# Patient Record
Sex: Female | Born: 1970 | Race: Black or African American | Hispanic: No | Marital: Single | Smoking: Current some day smoker
Health system: Southern US, Community
[De-identification: ages and names within clinical notes are randomized; demographics above are authoritative.]

## PROBLEM LIST (undated history)

## (undated) DIAGNOSIS — N6002 Solitary cyst of left breast: Secondary | ICD-10-CM

## (undated) DIAGNOSIS — F329 Major depressive disorder, single episode, unspecified: Secondary | ICD-10-CM

## (undated) DIAGNOSIS — G629 Polyneuropathy, unspecified: Secondary | ICD-10-CM

## (undated) DIAGNOSIS — F32A Depression, unspecified: Secondary | ICD-10-CM

## (undated) DIAGNOSIS — F419 Anxiety disorder, unspecified: Secondary | ICD-10-CM

## (undated) DIAGNOSIS — G4700 Insomnia, unspecified: Secondary | ICD-10-CM

## (undated) DIAGNOSIS — M8580 Other specified disorders of bone density and structure, unspecified site: Secondary | ICD-10-CM

## (undated) DIAGNOSIS — I10 Essential (primary) hypertension: Secondary | ICD-10-CM

## (undated) HISTORY — PX: DG SALIVARY GLANDS (ARMC HX): HXRAD1603

## (undated) HISTORY — DX: Polyneuropathy, unspecified: G62.9

## (undated) HISTORY — PX: OTHER SURGICAL HISTORY: SHX169

## (undated) HISTORY — DX: Depression, unspecified: F32.A

## (undated) HISTORY — DX: Anxiety disorder, unspecified: F41.9

## (undated) HISTORY — DX: Major depressive disorder, single episode, unspecified: F32.9

## (undated) HISTORY — PX: TUMOR REMOVAL: SHX12

## (undated) HISTORY — DX: Insomnia, unspecified: G47.00

## (undated) HISTORY — DX: Other specified disorders of bone density and structure, unspecified site: M85.80

## (undated) HISTORY — DX: Solitary cyst of left breast: N60.02

---

## 2010-04-20 ENCOUNTER — Emergency Department: Payer: Self-pay | Admitting: Emergency Medicine

## 2011-01-07 ENCOUNTER — Ambulatory Visit: Payer: Self-pay | Admitting: Family Medicine

## 2011-01-26 ENCOUNTER — Ambulatory Visit: Payer: Self-pay | Admitting: Family Medicine

## 2012-02-25 ENCOUNTER — Ambulatory Visit: Payer: Self-pay | Admitting: Family Medicine

## 2012-12-29 ENCOUNTER — Ambulatory Visit: Payer: Self-pay | Admitting: Unknown Physician Specialty

## 2013-04-03 LAB — HM PAP SMEAR

## 2013-06-12 ENCOUNTER — Encounter: Payer: Self-pay | Admitting: Podiatry

## 2013-06-12 ENCOUNTER — Ambulatory Visit (INDEPENDENT_AMBULATORY_CARE_PROVIDER_SITE_OTHER): Payer: BC Managed Care – PPO | Admitting: Podiatry

## 2013-06-12 ENCOUNTER — Ambulatory Visit: Payer: Self-pay

## 2013-06-12 VITALS — BP 115/69 | HR 88 | Resp 12 | Ht 66.0 in | Wt 138.0 lb

## 2013-06-12 DIAGNOSIS — L600 Ingrowing nail: Secondary | ICD-10-CM

## 2013-06-12 NOTE — Patient Instructions (Signed)

## 2013-06-12 NOTE — Progress Notes (Signed)
Subjective:     Patient ID: Wanda Hudson, female   DOB: 03-Jan-1971, 42 y.o.   MRN: [id number]  HPI patient points to the second nails bilateral stating that they have been painful and that she has tried to trim them soak them without relief of her symptoms. Also complains about her hallux left medial border that is discolored she has had a history of hammertoe repair fifth left   Review of Systems  All other systems reviewed and are negative.       Objective:   Physical Exam  Vitals reviewed. Constitutional: She appears well-developed and well-nourished.  Cardiovascular: Intact distal pulses.   Musculoskeletal: Normal range of motion.  Neurological: She is alert.  Skin: Skin is warm.   patient has incurvated nail bed second right and left both medial and lateral borders of the right and medial border of the left second toe. Mild discoloration left hallux which could be an ingrown toenail which may form but currently not painful and scar fifth toe left. No other issues noted mild equinus and muscle strength normal noted    Assessment:     Ingrown toenail deformity second bilateral and mild deformity left hallux    Plan:     H&P done reviewed and recommended correction of chronic ingrown toenails. Patient wants procedure understanding that could stress the nails and she ultimately could lose the nails. Infiltrated each hallux 60 mg Xylocaine Marcaine mixture and removed the medial and lateral borders of the right second nail and the medial border left second nail apply chemical phenol 3 applications 30 seconds followed by alcohol lavage and sterile dressing reappoint a recheck

## 2013-06-12 NOTE — Progress Notes (Signed)
N SWOLLEN, SORE L  B/L 2ND TOE D  4 MONTHS O  SLOWLY C  WORSE A  SHOES T  N/A

## 2014-06-14 ENCOUNTER — Ambulatory Visit: Payer: Self-pay

## 2014-06-14 LAB — HM MAMMOGRAPHY

## 2015-01-30 ENCOUNTER — Other Ambulatory Visit: Payer: Self-pay | Admitting: Unknown Physician Specialty

## 2015-03-06 ENCOUNTER — Ambulatory Visit (INDEPENDENT_AMBULATORY_CARE_PROVIDER_SITE_OTHER): Payer: BLUE CROSS/BLUE SHIELD

## 2015-03-06 DIAGNOSIS — Z3042 Encounter for surveillance of injectable contraceptive: Secondary | ICD-10-CM | POA: Diagnosis not present

## 2015-03-06 MED ORDER — MEDROXYPROGESTERONE ACETATE 150 MG/ML IM SUSP
150.0000 mg | Freq: Once | INTRAMUSCULAR | Status: AC
  Administered 2015-03-06: 150 mg via INTRAMUSCULAR

## 2015-05-07 DIAGNOSIS — M8580 Other specified disorders of bone density and structure, unspecified site: Secondary | ICD-10-CM

## 2015-05-07 DIAGNOSIS — G4700 Insomnia, unspecified: Secondary | ICD-10-CM | POA: Insufficient documentation

## 2015-05-07 DIAGNOSIS — F32A Depression, unspecified: Secondary | ICD-10-CM

## 2015-05-07 DIAGNOSIS — G629 Polyneuropathy, unspecified: Secondary | ICD-10-CM | POA: Insufficient documentation

## 2015-05-07 DIAGNOSIS — F329 Major depressive disorder, single episode, unspecified: Secondary | ICD-10-CM

## 2015-05-12 ENCOUNTER — Encounter: Payer: Self-pay | Admitting: Unknown Physician Specialty

## 2015-05-12 ENCOUNTER — Ambulatory Visit (INDEPENDENT_AMBULATORY_CARE_PROVIDER_SITE_OTHER): Payer: BLUE CROSS/BLUE SHIELD | Admitting: Unknown Physician Specialty

## 2015-05-12 VITALS — BP 128/78 | HR 65 | Temp 98.9°F | Ht 66.1 in | Wt 120.5 lb

## 2015-05-12 DIAGNOSIS — Z0000 Encounter for general adult medical examination without abnormal findings: Secondary | ICD-10-CM | POA: Diagnosis not present

## 2015-05-12 DIAGNOSIS — Z23 Encounter for immunization: Secondary | ICD-10-CM

## 2015-05-12 DIAGNOSIS — G4700 Insomnia, unspecified: Secondary | ICD-10-CM

## 2015-05-12 MED ORDER — HYDROXYZINE HCL 10 MG PO TABS: 10.0000 mg | ORAL_TABLET | Freq: Every evening | ORAL | Status: DC | PRN

## 2015-05-12 NOTE — Progress Notes (Signed)
BP 128/78 mmHg  Pulse 65  Temp(Src) 98.9 F (37.2 C)  Ht 5' 6.1" (1.679 m)  Wt 120 lb 8 oz (54.658 kg)  BMI 19.39 kg/m2  SpO2 100%  LMP  (LMP Unknown)   Subjective:    Patient ID: Wanda Hudson, female    DOB: 02-06-1971, 44 y.o.   MRN: [id number]  HPI: Wanda Hudson is a 44 y.o. female  Chief Complaint  Patient presents with  . Annual Exam   Annual Exam She presents to the office for an annual physical examination.  She has a hx of depression and is currently taking Escitalopram 5 mg tablet daily medication compliance is excellent she denies have any side effects secondary to the medication.  She is satisfied with the current treatment.  Pertinent negatives denies chest pain, palpitations, lightheadedness/dizziness, shortness of breath, or edema.  Insomnia Pt states this occurs intermittently she works 12 hours during the day and has problems with sleeping at night.  She states she was prescribed a medication for insomnia that she takes occasionally twice a month that has improved her quality of sleep she is unsure of the name of the medication.  Relevant past medical, surgical, family and social history reviewed and updated as indicated. Interim medical history since our last visit reviewed. Allergies and medications reviewed and updated.  Review of Systems  Constitutional: Negative for fever, chills, appetite change and unexpected weight change.  Respiratory: Negative for cough, chest tightness, shortness of breath and wheezing.   Cardiovascular: Negative for chest pain, palpitations and leg swelling.  Gastrointestinal: Negative for nausea, vomiting, abdominal pain, diarrhea and constipation.  Endocrine: Negative for cold intolerance, heat intolerance, polydipsia, polyphagia and polyuria.  Genitourinary: Negative for dysuria, frequency, hematuria, flank pain, vaginal bleeding, vaginal discharge, genital sores, vaginal pain and pelvic pain.  Musculoskeletal: Negative  for myalgias, back pain, arthralgias, neck pain and neck stiffness.  Skin: Negative for color change and rash.  Neurological: Negative for dizziness, tremors, seizures, syncope, weakness, light-headedness, numbness and headaches.  Psychiatric/Behavioral: Negative for suicidal ideas, confusion, sleep disturbance and self-injury. The patient is not nervous/anxious.     Per HPI unless specifically indicated above     Objective:    BP 128/78 mmHg  Pulse 65  Temp(Src) 98.9 F (37.2 C)  Ht 5' 6.1" (1.679 m)  Wt 120 lb 8 oz (54.658 kg)  BMI 19.39 kg/m2  SpO2 100%  LMP  (LMP Unknown)  Wt Readings from Last 3 Encounters:  05/12/15 120 lb 8 oz (54.658 kg)  09/17/14 124 lb (56.246 kg)  06/12/13 138 lb (62.596 kg)    Physical Exam  Constitutional: She is oriented to person, place, and time. She appears well-developed and well-nourished. No distress.  HENT:  Head: Normocephalic and atraumatic.  Right Ear: External ear normal.  Left Ear: External ear normal.  Nose: Nose normal.  Mouth/Throat: Oropharynx is clear and moist.  Neck: Normal range of motion. Neck supple.  Cardiovascular: Normal rate, regular rhythm, normal heart sounds and intact distal pulses.   Pulmonary/Chest: Effort normal and breath sounds normal. No respiratory distress. She has no wheezes. She has no rales.  Abdominal: Soft. Bowel sounds are normal. She exhibits no distension and no mass. There is no tenderness. There is no rebound and no guarding.  Genitourinary: No breast swelling, tenderness, discharge or bleeding.  No breast masses or lesions  Musculoskeletal: Normal range of motion. She exhibits no edema or tenderness.  Lymphadenopathy:    She has no cervical  adenopathy.  Neurological: She is alert and oriented to person, place, and time.  Skin: Skin is warm and dry. No rash noted. She is not diaphoretic. No erythema. No pallor.  Psychiatric: She has a normal mood and affect. Her behavior is normal. Judgment and  thought content normal.      Assessment & Plan:   Problem List Items Addressed This Visit      Unprioritized   Insomnia    Prescribed Hydroxyzine 25 mg tablet daily at bedtime as needed for insomnia Educated pt about using this medication sparingly due to antihistamine component of medication      Routine general medical examination at a health care facility    CBC with Differential ordered results pending Lipid panel ordered results pending Comprehensive metabolic panel ordered results pending HIV antibody ordered results pending Lipid panel ordered results pending TSH ordered results pending Tdap vaccine administered Influenza vaccine administered         Relevant Orders   CBC with Differential/Platelet   Comprehensive metabolic panel   HIV antibody   TSH   Lipid Panel w/o Chol/HDL Ratio   Tdap vaccine greater than or equal to 7yo IM (Completed)    Other Visit Diagnoses    Immunization due    -  Primary    Relevant Orders    Flu Vaccine QUAD 36+ mos PF IM (Fluarix & Fluzone Quad PF) (Completed)        Follow up plan: Return in about 1 year (around 05/11/2016).

## 2015-05-12 NOTE — Assessment & Plan Note (Addendum)
CBC with Differential ordered results pending Lipid panel ordered results pending Comprehensive metabolic panel ordered results pending HIV antibody ordered results pending Lipid panel ordered results pending TSH ordered results pending Tdap vaccine administered Influenza vaccine administered

## 2015-05-12 NOTE — Assessment & Plan Note (Signed)
Prescribed Hydroxyzine 25 mg tablet daily at bedtime as needed for insomnia Educated pt about using this medication sparingly due to antihistamine component of medication

## 2015-05-13 ENCOUNTER — Encounter: Payer: Self-pay | Admitting: Unknown Physician Specialty

## 2015-05-13 LAB — COMPREHENSIVE METABOLIC PANEL
A/G RATIO: 1.6 (ref 1.1–2.5)
ALBUMIN: 4.5 g/dL (ref 3.5–5.5)
ALT: 12 IU/L (ref 0–32)
AST: 16 IU/L (ref 0–40)
Alkaline Phosphatase: 74 IU/L (ref 39–117)
BUN / CREAT RATIO: 19 (ref 9–23)
BUN: 11 mg/dL (ref 6–24)
Bilirubin Total: 0.4 mg/dL (ref 0.0–1.2)
CO2: 22 mmol/L (ref 18–29)
CREATININE: 0.59 mg/dL (ref 0.57–1.00)
Calcium: 10.2 mg/dL (ref 8.7–10.2)
Chloride: 100 mmol/L (ref 97–108)
GFR calc Af Amer: 127 mL/min/{1.73_m2} (ref >59)
GFR calc non Af Amer: 110 mL/min/{1.73_m2} (ref >59)
GLOBULIN, TOTAL: 2.8 g/dL (ref 1.5–4.5)
Glucose: 93 mg/dL (ref 65–99)
Potassium: 4.3 mmol/L (ref 3.5–5.2)
SODIUM: 140 mmol/L (ref 134–144)
Total Protein: 7.3 g/dL (ref 6.0–8.5)

## 2015-05-13 LAB — CBC WITH DIFFERENTIAL/PLATELET
Basophils Absolute: 0.1 10*3/uL (ref 0.0–0.2)
Basos: 1 %
EOS (ABSOLUTE): 0.1 10*3/uL (ref 0.0–0.4)
EOS: 1 %
HEMATOCRIT: 42.6 % (ref 34.0–46.6)
HEMOGLOBIN: 13.8 g/dL (ref 11.1–15.9)
Immature Grans (Abs): 0 10*3/uL (ref 0.0–0.1)
Immature Granulocytes: 0 %
LYMPHS ABS: 2.3 10*3/uL (ref 0.7–3.1)
Lymphs: 25 %
MCH: 28.3 pg (ref 26.6–33.0)
MCHC: 32.4 g/dL (ref 31.5–35.7)
MCV: 87 fL (ref 79–97)
MONOS ABS: 0.8 10*3/uL (ref 0.1–0.9)
Monocytes: 8 %
NEUTROS ABS: 5.9 10*3/uL (ref 1.4–7.0)
Neutrophils: 65 %
Platelets: 334 10*3/uL (ref 150–379)
RBC: 4.88 x10E6/uL (ref 3.77–5.28)
RDW: 13.6 % (ref 12.3–15.4)
WBC: 9.1 10*3/uL (ref 3.4–10.8)

## 2015-05-13 LAB — LIPID PANEL W/O CHOL/HDL RATIO
Cholesterol, Total: 174 mg/dL (ref 100–199)
HDL: 47 mg/dL (ref >39)
LDL Calculated: 109 mg/dL — ABNORMAL HIGH (ref 0–99)
Triglycerides: 88 mg/dL (ref 0–149)
VLDL CHOLESTEROL CAL: 18 mg/dL (ref 5–40)

## 2015-05-13 LAB — TSH: TSH: 0.481 u[IU]/mL (ref 0.450–4.500)

## 2015-05-13 LAB — HIV ANTIBODY (ROUTINE TESTING W REFLEX): HIV SCREEN 4TH GENERATION: NONREACTIVE

## 2015-05-26 ENCOUNTER — Ambulatory Visit: Payer: BLUE CROSS/BLUE SHIELD

## 2015-05-29 ENCOUNTER — Ambulatory Visit (INDEPENDENT_AMBULATORY_CARE_PROVIDER_SITE_OTHER): Payer: BLUE CROSS/BLUE SHIELD

## 2015-05-29 DIAGNOSIS — Z308 Encounter for other contraceptive management: Secondary | ICD-10-CM

## 2015-05-29 MED ORDER — MEDROXYPROGESTERONE ACETATE 150 MG/ML IM SUSP
150.0000 mg | Freq: Once | INTRAMUSCULAR | Status: AC
  Administered 2015-05-29: 150 mg via INTRAMUSCULAR

## 2015-08-07 ENCOUNTER — Other Ambulatory Visit: Payer: Self-pay

## 2015-08-07 MED ORDER — ESCITALOPRAM OXALATE 5 MG PO TABS: 5.0000 mg | ORAL_TABLET | Freq: Every day | ORAL | Status: DC

## 2015-08-07 NOTE — Telephone Encounter (Signed)
Last Visit: 05/12/2015 Next Appt: 08/14/2015  Request for Escitalopram 5 mg tablet #90.

## 2015-08-12 ENCOUNTER — Other Ambulatory Visit: Payer: Self-pay

## 2015-08-12 MED ORDER — ESCITALOPRAM OXALATE 5 MG PO TABS: 5.0000 mg | ORAL_TABLET | Freq: Every day | ORAL | Status: DC

## 2015-08-12 NOTE — Telephone Encounter (Signed)
Rx sent to her pharmacy 

## 2015-08-12 NOTE — Telephone Encounter (Signed)
Patient was last seen 05/12/15 and has appt scheduled for 05/12/16. Pharmacy is [facility] in [place]. Medication was refilled 08/07/15 for 30 days but pharmacy is requesting 90 day supply.

## 2015-08-14 ENCOUNTER — Ambulatory Visit: Payer: BLUE CROSS/BLUE SHIELD

## 2015-08-14 ENCOUNTER — Other Ambulatory Visit: Payer: Self-pay | Admitting: Unknown Physician Specialty

## 2015-08-14 DIAGNOSIS — Z3009 Encounter for other general counseling and advice on contraception: Secondary | ICD-10-CM

## 2015-08-14 MED ORDER — MEDROXYPROGESTERONE ACETATE 150 MG/ML IM SUSP
150.0000 mg | INTRAMUSCULAR | Status: AC
  Administered 2015-08-19: 150 mg via INTRAMUSCULAR

## 2015-08-14 NOTE — Progress Notes (Signed)
This encounter was created in error - please disregard.  This encounter was created in error - please disregard.

## 2015-08-17 DIAGNOSIS — N6002 Solitary cyst of left breast: Secondary | ICD-10-CM

## 2015-08-17 HISTORY — PX: BREAST CYST ASPIRATION: SHX578

## 2015-08-17 HISTORY — DX: Solitary cyst of left breast: N60.02

## 2015-08-19 ENCOUNTER — Telehealth: Payer: Self-pay

## 2015-08-19 ENCOUNTER — Ambulatory Visit (INDEPENDENT_AMBULATORY_CARE_PROVIDER_SITE_OTHER): Payer: BLUE CROSS/BLUE SHIELD

## 2015-08-19 DIAGNOSIS — Z3009 Encounter for other general counseling and advice on contraception: Secondary | ICD-10-CM

## 2015-08-19 DIAGNOSIS — Z3042 Encounter for surveillance of injectable contraceptive: Secondary | ICD-10-CM

## 2015-08-19 NOTE — Telephone Encounter (Signed)
Called patient to let her know we now had depo injections so she could call to schedule her appointment.

## 2015-08-19 NOTE — Telephone Encounter (Signed)
Patient scheduled depo injection for this afternoon.

## 2015-11-05 ENCOUNTER — Ambulatory Visit (INDEPENDENT_AMBULATORY_CARE_PROVIDER_SITE_OTHER): Payer: BLUE CROSS/BLUE SHIELD

## 2015-11-05 ENCOUNTER — Other Ambulatory Visit: Payer: Self-pay | Admitting: Unknown Physician Specialty

## 2015-11-05 DIAGNOSIS — Z308 Encounter for other contraceptive management: Secondary | ICD-10-CM

## 2015-11-05 DIAGNOSIS — Z3042 Encounter for surveillance of injectable contraceptive: Secondary | ICD-10-CM

## 2015-11-05 MED ORDER — MEDROXYPROGESTERONE ACETATE 150 MG/ML IM SUSP
150.0000 mg | INTRAMUSCULAR | Status: AC
  Administered 2015-11-05 – 2016-07-14 (×4): 150 mg via INTRAMUSCULAR

## 2015-11-24 ENCOUNTER — Other Ambulatory Visit: Payer: Self-pay

## 2015-11-24 MED ORDER — HYDROXYZINE HCL 10 MG PO TABS: 10.0000 mg | ORAL_TABLET | Freq: Every evening | ORAL | Status: DC | PRN

## 2015-11-24 NOTE — Telephone Encounter (Signed)
Patient had physical on 05/12/15 and has next physical scheduled for 05/14/16. Pharmacy is [facility] in [place].

## 2015-12-01 ENCOUNTER — Telehealth: Payer: Self-pay

## 2015-12-01 MED ORDER — HYDROXYZINE HCL 10 MG PO TABS: 10.0000 mg | ORAL_TABLET | Freq: Every evening | ORAL | Status: DC | PRN

## 2015-12-01 NOTE — Telephone Encounter (Signed)
Pharmacy sent a fax requesting a 90 day prescription for patient's hydroxyzine. We sent in 30 with 2 refills on 11/24/15. Wanda Hudson- I dont know if you want to change the rx or not.

## 2015-12-01 NOTE — Telephone Encounter (Signed)
Will change

## 2016-02-03 ENCOUNTER — Ambulatory Visit (INDEPENDENT_AMBULATORY_CARE_PROVIDER_SITE_OTHER): Payer: BLUE CROSS/BLUE SHIELD

## 2016-02-03 DIAGNOSIS — Z308 Encounter for other contraceptive management: Secondary | ICD-10-CM | POA: Diagnosis not present

## 2016-02-03 DIAGNOSIS — Z3042 Encounter for surveillance of injectable contraceptive: Secondary | ICD-10-CM

## 2016-03-02 ENCOUNTER — Other Ambulatory Visit: Payer: Self-pay

## 2016-03-02 MED ORDER — ESCITALOPRAM OXALATE 5 MG PO TABS: 5.0000 mg | ORAL_TABLET | Freq: Every day | ORAL | Status: DC

## 2016-04-27 ENCOUNTER — Ambulatory Visit (INDEPENDENT_AMBULATORY_CARE_PROVIDER_SITE_OTHER): Payer: BLUE CROSS/BLUE SHIELD

## 2016-04-27 DIAGNOSIS — Z308 Encounter for other contraceptive management: Secondary | ICD-10-CM

## 2016-04-27 DIAGNOSIS — Z3042 Encounter for surveillance of injectable contraceptive: Secondary | ICD-10-CM

## 2016-05-03 ENCOUNTER — Other Ambulatory Visit: Payer: Self-pay | Admitting: Unknown Physician Specialty

## 2016-05-03 NOTE — Telephone Encounter (Signed)
rx

## 2016-05-12 ENCOUNTER — Encounter: Payer: BLUE CROSS/BLUE SHIELD | Admitting: Unknown Physician Specialty

## 2016-05-14 ENCOUNTER — Ambulatory Visit (INDEPENDENT_AMBULATORY_CARE_PROVIDER_SITE_OTHER): Payer: BLUE CROSS/BLUE SHIELD | Admitting: Unknown Physician Specialty

## 2016-05-14 ENCOUNTER — Encounter: Payer: Self-pay | Admitting: Unknown Physician Specialty

## 2016-05-14 VITALS — BP 161/82 | HR 89 | Temp 98.3°F | Ht 65.5 in | Wt 123.6 lb

## 2016-05-14 DIAGNOSIS — N63 Unspecified lump in unspecified breast: Secondary | ICD-10-CM | POA: Insufficient documentation

## 2016-05-14 DIAGNOSIS — Z23 Encounter for immunization: Secondary | ICD-10-CM

## 2016-05-14 DIAGNOSIS — Z0000 Encounter for general adult medical examination without abnormal findings: Secondary | ICD-10-CM | POA: Diagnosis not present

## 2016-05-14 DIAGNOSIS — F329 Major depressive disorder, single episode, unspecified: Secondary | ICD-10-CM

## 2016-05-14 DIAGNOSIS — F32A Depression, unspecified: Secondary | ICD-10-CM

## 2016-05-14 MED ORDER — LORAZEPAM 0.5 MG PO TABS: 0.5000 mg | ORAL_TABLET | Freq: Every day | ORAL | 0 refills | Status: DC

## 2016-05-14 NOTE — Progress Notes (Signed)
BP (!) 161/82 (BP Location: Left Arm, Patient Position: Sitting, Cuff Size: Normal)   Pulse 89   Temp 98.3 F (36.8 C)   Ht 5' 5.5" (1.664 m)   Wt 123 lb 9.6 oz (56.1 kg)   LMP  (LMP Unknown)   SpO2 99%   BMI 20.26 kg/m    Subjective:    Patient ID: Wanda Hudson, female    DOB: 1970/10/08, 45 y.o.   MRN: [id number]  HPI: Wanda Hudson is a 45 y.o. female  Chief Complaint  Patient presents with  . Annual Exam    pt states she has found a lump in left breast    Depression Depression screen [facility] 2/9 05/14/2016 05/12/2015  Decreased Interest 0 0  Down, Depressed, Hopeless 0 0  PHQ - 2 Score 0 0  Altered sleeping 1 -  Tired, decreased energy 0 -  Change in appetite 1 -  Feeling bad or failure about yourself  0 -  Trouble concentrating 0 -  Moving slowly or fidgety/restless 0 -  Suicidal thoughts 0 -  PHQ-9 Score 2 -   "I found a lump" which she discovered over the weekend.  She thinks this is why BP is high as she is nervous.    Social History   Social History  . Marital status: Single    Spouse name: N/A  . Number of children: N/A  . Years of education: N/A   Occupational History  . Not on file.   Social History Main Topics  . Smoking status: Current Some Day Smoker  . Smokeless tobacco: Never Used  . Alcohol use No  . Drug use: No  . Sexual activity: Yes    Birth control/ protection: Injection   Other Topics Concern  . Not on file   Social History Narrative  . No narrative on file   Family History  Problem Relation Age of Onset  . Diabetes Mother   . Hypertension Mother   . Stroke Mother   . Diabetes Father   . Hypertension Father   . Heart disease Maternal Grandfather     MI  . Heart disease Paternal Grandfather     MI  . Hypertension Brother   . Aneurysm Maternal Grandmother    Past Medical History:  Diagnosis Date  . Anxiety   . Depression   . Insomnia   . Osteopenia   . Peripheral neuropathy [facility])    Past Surgical History:    Procedure Laterality Date  . 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 DG SALIVARY GLANDS ([place] HX)    . TUMOR REMOVAL     ears    Relevant past medical, surgical, family and social history reviewed and updated as indicated. Interim medical history since our last visit reviewed. Allergies and medications reviewed and updated.  Review of Systems  Per HPI unless specifically indicated above     Objective:    BP (!) 161/82 (BP Location: Left Arm, Patient Position: Sitting, Cuff Size: Normal)   Pulse 89   Temp 98.3 F (36.8 C)   Ht 5' 5.5" (1.664 m)   Wt 123 lb 9.6 oz (56.1 kg)   LMP  (LMP Unknown)   SpO2 99%   BMI 20.26 kg/m   Wt Readings from Last 3 Encounters:  05/14/16 123 lb 9.6 oz (56.1 kg)  05/12/15 120 lb 8 oz (54.7 kg)  09/17/14 124 lb (56.2 kg)    Physical Exam  Constitutional: She is oriented  to person, place, and time. She appears well-developed and well-nourished.  HENT:  Head: Normocephalic and atraumatic.  Eyes: Pupils are equal, round, and reactive to light. Right eye exhibits no discharge. Left eye exhibits no discharge. No scleral icterus.  Neck: Normal range of motion. Neck supple. Carotid bruit is not present. No thyromegaly present.  Cardiovascular: Normal rate, regular rhythm and normal heart sounds.  Exam reveals no gallop and no friction rub.   No murmur heard. Pulmonary/Chest: Effort normal and breath sounds normal. No respiratory distress. She has no wheezes. She has no rales. Right breast exhibits no inverted nipple, no mass, no nipple discharge, no skin change and no tenderness. Left breast exhibits mass. Left breast exhibits no inverted nipple, no nipple discharge, no skin change and no tenderness.  Large mass left breast.  Not irregular  Abdominal: Soft. Bowel sounds are normal. There is no tenderness. There is no rebound.  Genitourinary: Vagina normal and uterus normal. Cervix exhibits no motion tenderness, no discharge and no friability. Right  adnexum displays no mass, no tenderness and no fullness. Left adnexum displays no mass, no tenderness and no fullness.  Musculoskeletal: Normal range of motion.  Lymphadenopathy:    She has no cervical adenopathy.  Neurological: She is alert and oriented to person, place, and time.  Skin: Skin is warm, dry and intact. No rash noted.  Psychiatric: She has a normal mood and affect. Her speech is normal and behavior is normal. Judgment and thought content normal. Cognition and memory are normal.    Results for orders placed or performed in visit on 05/12/15  CBC with Differential/Platelet  Result Value Ref Range   WBC 9.1 3.4 - 10.8 x10E3/uL   RBC 4.88 3.77 - 5.28 x10E6/uL   Hemoglobin 13.8 11.1 - 15.9 g/dL   Hematocrit 42.6 34.0 - 46.6 %   MCV 87 79 - 97 fL   MCH 28.3 26.6 - 33.0 pg   MCHC 32.4 31.5 - 35.7 g/dL   RDW 13.6 12.3 - 15.4 %   Platelets 334 150 - 379 x10E3/uL   Neutrophils 65 %   Lymphs 25 %   Monocytes 8 %   Eos 1 %   Basos 1 %   Neutrophils Absolute 5.9 1.4 - 7.0 x10E3/uL   Lymphocytes Absolute 2.3 0.7 - 3.1 x10E3/uL   Monocytes Absolute 0.8 0.1 - 0.9 x10E3/uL   EOS (ABSOLUTE) 0.1 0.0 - 0.4 x10E3/uL   Basophils Absolute 0.1 0.0 - 0.2 x10E3/uL   Immature Granulocytes 0 %   Immature Grans (Abs) 0.0 0.0 - 0.1 x10E3/uL  Comprehensive metabolic panel  Result Value Ref Range   Glucose 93 65 - 99 mg/dL   BUN 11 6 - 24 mg/dL   Creatinine, Ser 0.59 0.57 - 1.00 mg/dL   GFR calc non Af Amer 110 >59 mL/min/1.73   GFR calc Af Amer 127 >59 mL/min/1.73   BUN/Creatinine Ratio 19 9 - 23   Sodium 140 134 - 144 mmol/L   Potassium 4.3 3.5 - 5.2 mmol/L   Chloride 100 97 - 108 mmol/L   CO2 22 18 - 29 mmol/L   Calcium 10.2 8.7 - 10.2 mg/dL   Total Protein 7.3 6.0 - 8.5 g/dL   Albumin 4.5 3.5 - 5.5 g/dL   Globulin, Total 2.8 1.5 - 4.5 g/dL   Albumin/Globulin Ratio 1.6 1.1 - 2.5   Bilirubin Total 0.4 0.0 - 1.2 mg/dL   Alkaline Phosphatase 74 39 - 117 IU/L   AST 16 0 -  40 IU/L    ALT 12 0 - 32 IU/L  HIV antibody  Result Value Ref Range   HIV Screen 4th Generation wRfx Non Reactive Non Reactive  TSH  Result Value Ref Range   TSH 0.481 0.450 - 4.500 uIU/mL  Lipid Panel w/o Chol/HDL Ratio  Result Value Ref Range   Cholesterol, Total 174 100 - 199 mg/dL   Triglycerides 88 0 - 149 mg/dL   HDL 47 >39 mg/dL   VLDL Cholesterol Cal 18 5 - 40 mg/dL   LDL Calculated 109 (H) 0 - 99 mg/dL      Assessment & Plan:   Problem List Items Addressed This Visit      Unprioritized   Breast mass in female    Schedule mammogram and refer to surgery      Relevant Orders   MM Digital Diagnostic Unilat L   MM DIGITAL SCREENING BILATERAL   Depressive disorder    Stable, continue present medications.         Other Visit Diagnoses    Need for influenza vaccination    -  Primary   Relevant Orders   Flu Vaccine QUAD 36+ mos IM (Completed)   Annual physical exam       Relevant Orders   CBC with Differential/Platelet   Comprehensive metabolic panel   Lipid Panel w/o Chol/HDL Ratio   TSH   MM DIGITAL SCREENING BILATERAL       Follow up plan: No Follow-up on file.

## 2016-05-14 NOTE — Assessment & Plan Note (Signed)
Schedule mammogram and refer to surgery

## 2016-05-14 NOTE — Addendum Note (Signed)
Addended by: Kathrine Haddock on: 05/14/2016 09:01 AM   Modules accepted: Orders

## 2016-05-14 NOTE — Patient Instructions (Signed)

## 2016-05-14 NOTE — Assessment & Plan Note (Signed)
Stable, continue present medications.   

## 2016-05-15 LAB — LIPID PANEL W/O CHOL/HDL RATIO
CHOLESTEROL TOTAL: 165 mg/dL (ref 100–199)
HDL: 43 mg/dL (ref >39)
LDL Calculated: 102 mg/dL — ABNORMAL HIGH (ref 0–99)
Triglycerides: 99 mg/dL (ref 0–149)
VLDL CHOLESTEROL CAL: 20 mg/dL (ref 5–40)

## 2016-05-15 LAB — COMPREHENSIVE METABOLIC PANEL
ALBUMIN: 4.5 g/dL (ref 3.5–5.5)
ALK PHOS: 65 IU/L (ref 39–117)
ALT: 6 IU/L (ref 0–32)
AST: 15 IU/L (ref 0–40)
Albumin/Globulin Ratio: 1.6 (ref 1.2–2.2)
BILIRUBIN TOTAL: 0.3 mg/dL (ref 0.0–1.2)
BUN / CREAT RATIO: 19 (ref 9–23)
BUN: 11 mg/dL (ref 6–24)
CHLORIDE: 101 mmol/L (ref 96–106)
CO2: 22 mmol/L (ref 18–29)
Calcium: 10 mg/dL (ref 8.7–10.2)
Creatinine, Ser: 0.57 mg/dL (ref 0.57–1.00)
GFR calc Af Amer: 128 mL/min/{1.73_m2} (ref >59)
GFR calc non Af Amer: 111 mL/min/{1.73_m2} (ref >59)
GLUCOSE: 91 mg/dL (ref 65–99)
Globulin, Total: 2.8 g/dL (ref 1.5–4.5)
Potassium: 4 mmol/L (ref 3.5–5.2)
Sodium: 139 mmol/L (ref 134–144)
Total Protein: 7.3 g/dL (ref 6.0–8.5)

## 2016-05-15 LAB — CBC WITH DIFFERENTIAL/PLATELET
BASOS ABS: 0 10*3/uL (ref 0.0–0.2)
Basos: 0 %
EOS (ABSOLUTE): 0.1 10*3/uL (ref 0.0–0.4)
Eos: 1 %
HEMOGLOBIN: 13.4 g/dL (ref 11.1–15.9)
Hematocrit: 40.8 % (ref 34.0–46.6)
Immature Grans (Abs): 0 10*3/uL (ref 0.0–0.1)
Immature Granulocytes: 0 %
LYMPHS ABS: 2.3 10*3/uL (ref 0.7–3.1)
Lymphs: 22 %
MCH: 28.3 pg (ref 26.6–33.0)
MCHC: 32.8 g/dL (ref 31.5–35.7)
MCV: 86 fL (ref 79–97)
MONOS ABS: 1 10*3/uL — AB (ref 0.1–0.9)
Monocytes: 9 %
NEUTROS ABS: 7.1 10*3/uL — AB (ref 1.4–7.0)
Neutrophils: 68 %
PLATELETS: 346 10*3/uL (ref 150–379)
RBC: 4.73 x10E6/uL (ref 3.77–5.28)
RDW: 13.4 % (ref 12.3–15.4)
WBC: 10.6 10*3/uL (ref 3.4–10.8)

## 2016-05-15 LAB — TSH: TSH: 1.01 u[IU]/mL (ref 0.450–4.500)

## 2016-05-17 ENCOUNTER — Other Ambulatory Visit: Payer: Self-pay | Admitting: Unknown Physician Specialty

## 2016-05-17 ENCOUNTER — Ambulatory Visit
Admission: RE | Admit: 2016-05-17 | Discharge: 2016-05-17 | Disposition: A | Discharge status: Home / Self Care | Payer: BLUE CROSS/BLUE SHIELD | Source: Ambulatory Visit

## 2016-05-17 ENCOUNTER — Ambulatory Visit
Admission: RE | Admit: 2016-05-17 | Discharge: 2016-05-17 | Disposition: A | Discharge status: Home / Self Care | Payer: BLUE CROSS/BLUE SHIELD | Source: Ambulatory Visit | Attending: Unknown Physician Specialty | Admitting: Unknown Physician Specialty

## 2016-05-17 ENCOUNTER — Encounter: Payer: Self-pay | Admitting: Unknown Physician Specialty

## 2016-05-17 ENCOUNTER — Telehealth: Payer: Self-pay | Admitting: Unknown Physician Specialty

## 2016-05-17 DIAGNOSIS — N63 Unspecified lump in unspecified breast: Secondary | ICD-10-CM

## 2016-05-17 DIAGNOSIS — N6002 Solitary cyst of left breast: Secondary | ICD-10-CM

## 2016-05-17 DIAGNOSIS — Z0000 Encounter for general adult medical examination without abnormal findings: Secondary | ICD-10-CM

## 2016-05-17 NOTE — Telephone Encounter (Signed)
Routing to provider  

## 2016-05-17 NOTE — Telephone Encounter (Signed)
Wanda Hudson from [facility] would like to get an order for a Diagnostic bilateral mammogram as well as right and left ultrasound. Pt is already there for an appt.

## 2016-05-17 NOTE — Progress Notes (Signed)
Normal labs.  Patient notified by letter.

## 2016-05-18 ENCOUNTER — Other Ambulatory Visit: Payer: BLUE CROSS/BLUE SHIELD

## 2016-05-18 LAB — IGP, APTIMA HPV, RFX 16/18,45
HPV APTIMA: NEGATIVE
PAP SMEAR COMMENT: 0

## 2016-05-19 ENCOUNTER — Encounter: Payer: Self-pay | Admitting: General Surgery

## 2016-05-19 ENCOUNTER — Telehealth: Payer: Self-pay | Admitting: Unknown Physician Specialty

## 2016-05-19 DIAGNOSIS — N63 Unspecified lump in unspecified breast: Secondary | ICD-10-CM

## 2016-05-19 NOTE — Telephone Encounter (Signed)
Discussed [place].  Will refer to Dr. Fleet Contras for further management of breast cyst

## 2016-05-26 ENCOUNTER — Encounter: Payer: Self-pay | Admitting: *Deleted

## 2016-05-27 ENCOUNTER — Ambulatory Visit (INDEPENDENT_AMBULATORY_CARE_PROVIDER_SITE_OTHER): Payer: BLUE CROSS/BLUE SHIELD | Admitting: General Surgery

## 2016-05-27 ENCOUNTER — Inpatient Hospital Stay: Payer: Self-pay

## 2016-05-27 ENCOUNTER — Encounter: Payer: Self-pay | Admitting: General Surgery

## 2016-05-27 VITALS — BP 138/72 | HR 76 | Resp 12 | Ht 65.0 in | Wt 122.0 lb

## 2016-05-27 DIAGNOSIS — N6002 Solitary cyst of left breast: Secondary | ICD-10-CM

## 2016-05-27 NOTE — Progress Notes (Signed)
Patient ID: Wanda Hudson, female   DOB: February 20, 1971, 45 y.o.   MRN: [id number]  Chief Complaint  Patient presents with  . Breast Problem    abnormal mammogram    HPI Wanda Hudson is a 45 y.o. female.  who presents for a breast evaluation. The most recent mammogram and ultrasound was done on 05-17-16.  Patient does perform regular self breast checks and gets regular mammograms done.   She is here today with her aunt Wanda Hudson.74 She can feel a lump in left breast noticed 05-06-16 because it was itching. It has not changed in size, no pain.  HPI  Past Medical History:  Diagnosis Date  . Anxiety   . Depression   . Insomnia   . Osteopenia   . Peripheral neuropathy [facility])     Past Surgical History:  Procedure Laterality Date  . 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 DG SALIVARY GLANDS ([place] HX)    . TUMOR REMOVAL     ears    Family History  Problem Relation Age of Onset  . Diabetes Mother   . Hypertension Mother   . Stroke Mother   . Diabetes Father   . Hypertension Father   . Heart disease Maternal Grandfather     MI  . Heart disease Paternal Grandfather     MI  . Hypertension Brother   . Aneurysm Maternal Grandmother     Social History Social History  Substance Use Topics  . Smoking status: Current Some Day Smoker    Packs/day: 0.50    Years: 21.00    Types: Cigarettes  . Smokeless tobacco: Never Used  . Alcohol use No     Comment: occasionally    Allergies  Allergen Reactions  . Codeine Rash    Current Outpatient Prescriptions  Medication Sig Dispense Refill  . Aspirin-Salicylamide-Caffeine (BC FAST PAIN RELIEF) [phone] MG PACK Take by mouth as needed.    Marland Kitchen escitalopram (LEXAPRO) 5 MG tablet Take 1 tablet (5 mg total) by mouth daily. 90 tablet 1  . Evening Primrose Oil 500 MG CAPS Take 500 mg by mouth daily.    . hydrOXYzine (ATARAX/VISTARIL) 10 MG tablet TAKE 1 TABLET (10 MG TOTAL) BY MOUTH AT BEDTIME AS NEEDED. 90 tablet 1  . LORazepam  (ATIVAN) 0.5 MG tablet Take 1 tablet (0.5 mg total) by mouth at bedtime. 20 tablet 0  . vitamin B-12 (CYANOCOBALAMIN) 250 MCG tablet Take 250 mcg by mouth daily.    . Vitamin D, Cholecalciferol, 1000 UNITS TABS Take 1,000 Units by mouth daily.     Current Facility-Administered Medications  Medication Dose Route Frequency Provider Last Rate Last Dose  . medroxyPROGESTERone (DEPO-PROVERA) injection 150 mg  150 mg Intramuscular Q90 days Kathrine Haddock, NP   150 mg at 08/19/15 1332  . medroxyPROGESTERone (DEPO-PROVERA) injection 150 mg  150 mg Intramuscular Q90 days Kathrine Haddock, NP   150 mg at 04/27/16 [id number]    Review of Systems Review of Systems  Constitutional: Negative.   Respiratory: Negative.   Cardiovascular: Negative.     Blood pressure 138/72, pulse 76, resp. rate 12, height 5\' 5"  (1.651 m), weight 122 lb (55.3 kg).  Physical Exam Physical Exam  Constitutional: She is oriented to person, place, and time. She appears well-developed and well-nourished.  HENT:  Mouth/Throat: Oropharynx is clear and moist.  Eyes: Conjunctivae are normal. No scleral icterus.  Neck: Neck supple.  Cardiovascular: Normal rate, regular rhythm and normal heart sounds.  Pulmonary/Chest: Effort normal and breath sounds normal. Right breast exhibits no inverted nipple, no mass, no nipple discharge, no skin change and no tenderness. Left breast exhibits mass. Left breast exhibits no inverted nipple, no nipple discharge, no skin change and no tenderness.    Mass left breast  Lymphadenopathy:    She has no cervical adenopathy.    She has no axillary adenopathy.  Neurological: She is alert and oriented to person, place, and time.  Skin: Skin is warm and dry.  Psychiatric: Her behavior is normal.    Data Reviewed Bilateral mammograms of 05/17/2016 as well as left breast ultrasound the same day were reviewed. BI-RADS-2.  Ultrasound examination of the left breast in the upper-outer quadrant showed a 2.5 x  5.5 x 5.76 cm simple cyst with strong posterior acoustic enhancement and no internal lesions. The patient requested aspiration secondary to local discomfort. This was completed using 1 mL of 1% plain Xylocaine. The area yielded 35 mL of slightly turbid fluid which was discarded. The cyst resolved completely. The cavity was filled with 15 mL of air which was well tolerated.   Assessment     Left breast cyst, resolved with aspiration..      Plan    The itching may have occurred from underlying pressure from the cyst. The patient has been asked to call one week with her progress report in this regard.  With of moderate volume size of the cyst she is aware that this may recur and she has been urged to call the office directly if this occurs.     Follow up as needed. Phone follow up in one week regarding itching. The patient is aware to call back for any questions or concerns. Continue self breast exams. Call office for any new breast issues or concerns.   This information has been scribed by Karie Fetch RN, BSN,BC.   Wanda Hudson 05/27/2016, 9:43 PM

## 2016-05-27 NOTE — Patient Instructions (Addendum)
The patient is aware to call back for any questions or concerns. Follow up as needed. Phone follow up in one week regarding itching Continue self breast exams. Call office for any new breast issues or concerns.

## 2016-07-14 ENCOUNTER — Ambulatory Visit (INDEPENDENT_AMBULATORY_CARE_PROVIDER_SITE_OTHER): Payer: BLUE CROSS/BLUE SHIELD

## 2016-07-14 DIAGNOSIS — Z3042 Encounter for surveillance of injectable contraceptive: Secondary | ICD-10-CM

## 2016-07-14 DIAGNOSIS — Z308 Encounter for other contraceptive management: Secondary | ICD-10-CM

## 2016-09-29 ENCOUNTER — Ambulatory Visit: Payer: BLUE CROSS/BLUE SHIELD

## 2016-09-29 ENCOUNTER — Ambulatory Visit (INDEPENDENT_AMBULATORY_CARE_PROVIDER_SITE_OTHER): Payer: BLUE CROSS/BLUE SHIELD

## 2016-09-29 DIAGNOSIS — Z3042 Encounter for surveillance of injectable contraceptive: Secondary | ICD-10-CM

## 2016-09-29 MED ORDER — MEDROXYPROGESTERONE ACETATE 150 MG/ML IM SUSP
150.0000 mg | Freq: Once | INTRAMUSCULAR | Status: AC
  Administered 2016-09-29: 150 mg via INTRAMUSCULAR

## 2016-12-15 ENCOUNTER — Ambulatory Visit: Payer: BLUE CROSS/BLUE SHIELD

## 2016-12-29 ENCOUNTER — Ambulatory Visit (INDEPENDENT_AMBULATORY_CARE_PROVIDER_SITE_OTHER): Payer: BLUE CROSS/BLUE SHIELD

## 2016-12-29 DIAGNOSIS — Z3042 Encounter for surveillance of injectable contraceptive: Secondary | ICD-10-CM | POA: Diagnosis not present

## 2016-12-29 MED ORDER — MEDROXYPROGESTERONE ACETATE 150 MG/ML IM SUSP
150.0000 mg | Freq: Once | INTRAMUSCULAR | Status: AC
  Administered 2016-12-29: 150 mg via INTRAMUSCULAR

## 2017-03-21 ENCOUNTER — Ambulatory Visit (INDEPENDENT_AMBULATORY_CARE_PROVIDER_SITE_OTHER): Payer: 59

## 2017-03-21 DIAGNOSIS — Z3042 Encounter for surveillance of injectable contraceptive: Secondary | ICD-10-CM | POA: Diagnosis not present

## 2017-03-21 MED ORDER — MEDROXYPROGESTERONE ACETATE 150 MG/ML IM SUSP
150.0000 mg | Freq: Once | INTRAMUSCULAR | Status: AC
  Administered 2017-03-21: 150 mg via INTRAMUSCULAR

## 2017-05-02 IMAGING — MG 2D DIGITAL DIAGNOSTIC BILATERAL MAMMOGRAM WITH CAD AND ADJUNCT T
8 of 12 series · 8 of 28 positions shown · non-contrast
Comparison: Previous exam(s).

CLINICAL DATA: 45-year-old female with left breast lump

EXAM:
2D DIGITAL DIAGNOSTIC BILATERAL MAMMOGRAM WITH CAD AND ADJUNCT TOMO
ULTRASOUND LEFT BREAST

[R MLO]
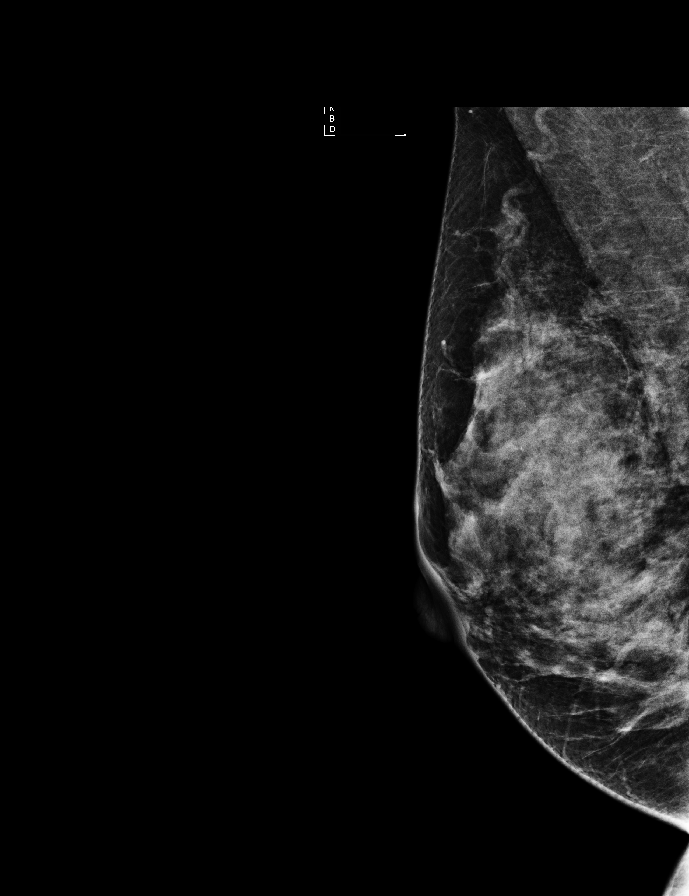

[L MLO synth-2D]
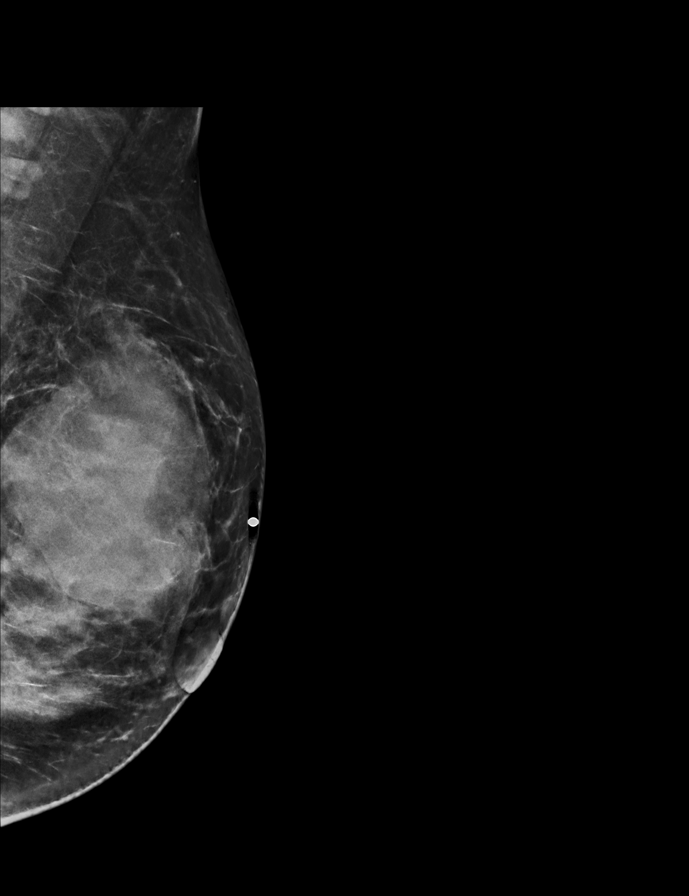

[R CC]
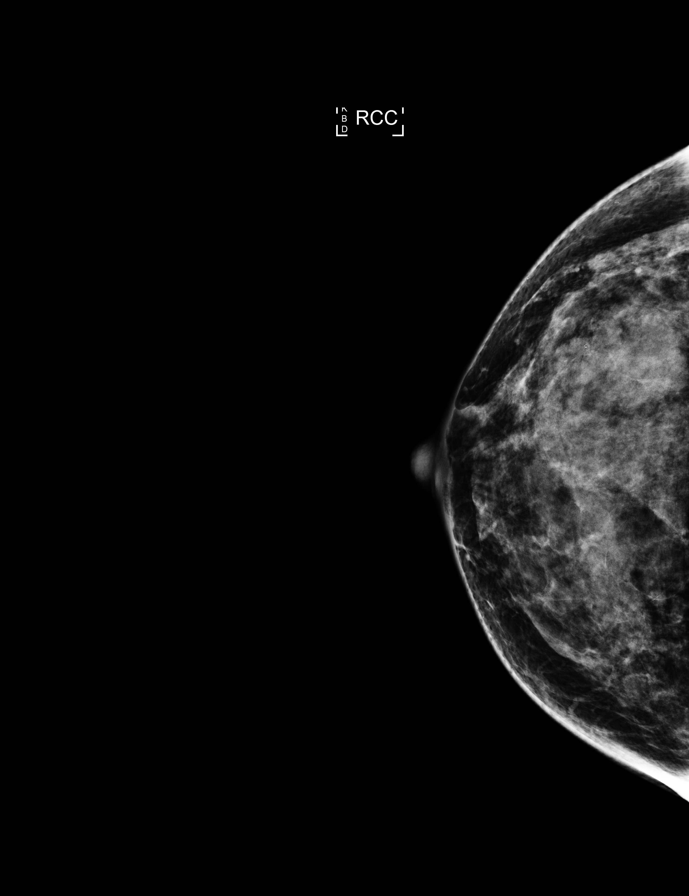

[L MLO]
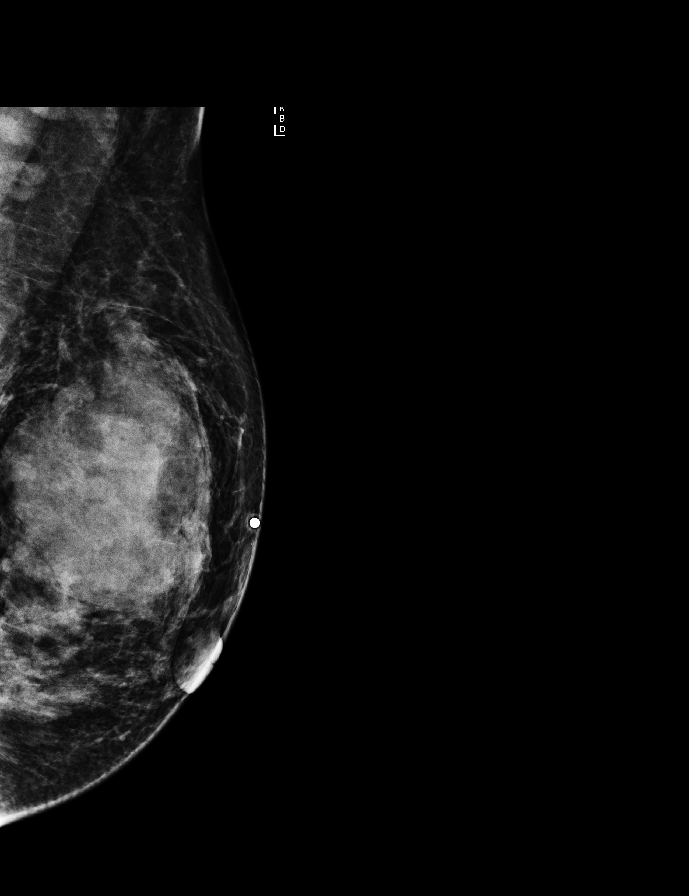

[R MLO synth-2D]
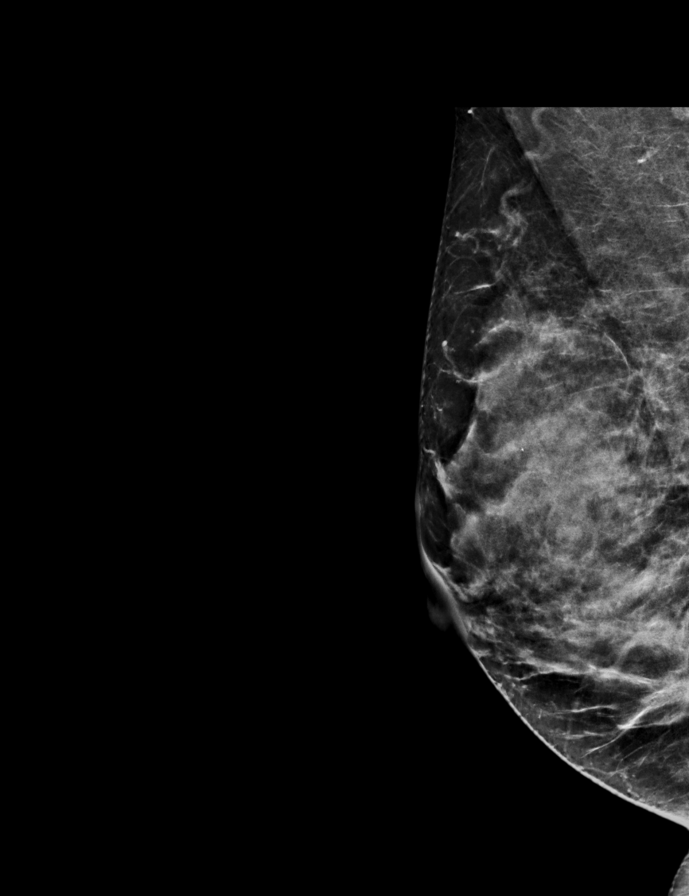

[L CC synth-2D]
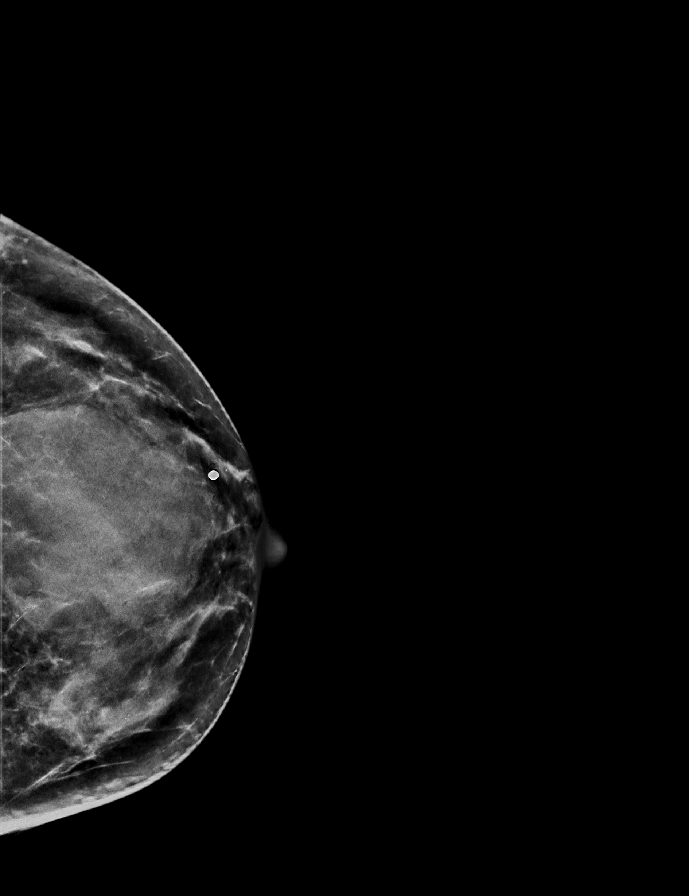

[L CC]
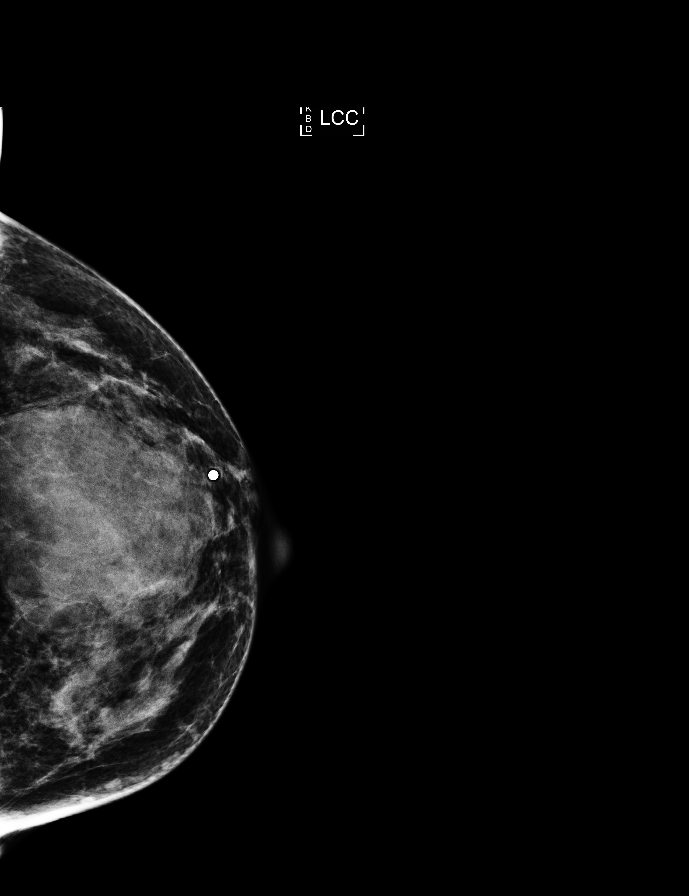

[R CC synth-2D]
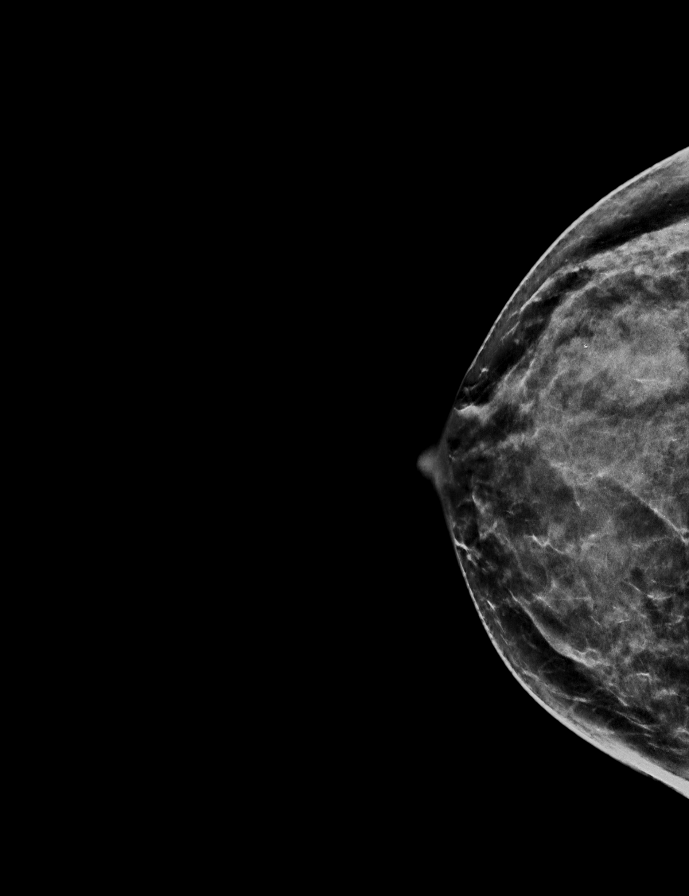

[8 of 28 positions shown; findings below may reference images not displayed]

ACR Breast Density Category d: The breast tissue is extremely dense,
which lowers the sensitivity of mammography.
FINDINGS: A large oval, circumscribed mass is noted within the upper, slightly
outer left breast underlying the palpable marker. There is mild
prominence of the left axillary lymph nodes. No suspicious mass,
calcifications, or other abnormality is identified within the right
breast.

Mammographic images were processed with CAD.

On physical exam, there is a 6 cm palpable mass centered at 1
o'clock, 2 cm from the nipple, corresponding to the palpable
abnormality.

Targeted ultrasound is performed, showing a large cyst at 1 o'clock,
2 cm from the nipple measuring 6.3 x 2.6 x 5.1 cm, corresponding the
palpable abnormality. Ultrasound of the left axilla demonstrates
normal appearing axillary lymph nodes.
IMPRESSION: Left breast cyst. No mammographic or sonographic evidence of
malignancy.

RECOMMENDATION:
1. Screening mammogram in one year.(Code:[id number])
2. Ultrasound-guided aspiration of the left breast cyst for
symptomatic relief.

I have discussed the findings and recommendations with the patient.
Results were also provided in writing at the conclusion of the
visit. If applicable, a reminder letter will be sent to the patient
regarding the next appointment.

BI-RADS CATEGORY  2: Benign.

## 2017-05-16 ENCOUNTER — Encounter: Payer: BLUE CROSS/BLUE SHIELD | Admitting: Unknown Physician Specialty

## 2017-05-17 ENCOUNTER — Encounter: Payer: Self-pay | Admitting: Unknown Physician Specialty

## 2017-05-17 ENCOUNTER — Ambulatory Visit (INDEPENDENT_AMBULATORY_CARE_PROVIDER_SITE_OTHER): Payer: 59 | Admitting: Unknown Physician Specialty

## 2017-05-17 VITALS — BP 167/99 | HR 76 | Temp 98.4°F | Ht 65.2 in | Wt 122.5 lb

## 2017-05-17 DIAGNOSIS — G629 Polyneuropathy, unspecified: Secondary | ICD-10-CM

## 2017-05-17 DIAGNOSIS — Z0000 Encounter for general adult medical examination without abnormal findings: Secondary | ICD-10-CM | POA: Diagnosis not present

## 2017-05-17 DIAGNOSIS — H6061 Unspecified chronic otitis externa, right ear: Secondary | ICD-10-CM

## 2017-05-17 DIAGNOSIS — I10 Essential (primary) hypertension: Secondary | ICD-10-CM | POA: Diagnosis not present

## 2017-05-17 DIAGNOSIS — H6090 Unspecified otitis externa, unspecified ear: Secondary | ICD-10-CM | POA: Insufficient documentation

## 2017-05-17 MED ORDER — HYDROCHLOROTHIAZIDE 25 MG PO TABS: 25.0000 mg | ORAL_TABLET | Freq: Every day | ORAL | 1 refills | Status: DC

## 2017-05-17 MED ORDER — NEOMYCIN-POLYMYXIN-HC 3.5-10000-1 OT SOLN: 4.0000 [drp] | Freq: Four times a day (QID) | OTIC | 0 refills | Status: DC

## 2017-05-17 NOTE — Assessment & Plan Note (Addendum)
New problem.  Will start HCTZ. 25 mg.

## 2017-05-17 NOTE — Assessment & Plan Note (Signed)
Improved with B12.  Check today

## 2017-05-17 NOTE — Assessment & Plan Note (Signed)
Corticosporin ear drops

## 2017-05-17 NOTE — Progress Notes (Signed)
BP (!) 167/99 (BP Location: Left Arm, Cuff Size: Small)   Pulse 76   Temp 98.4 F (36.9 C)   Ht 5' 5.2" (1.656 m)   Wt 122 lb 8 oz (55.6 kg)   LMP  (LMP Unknown)   SpO2 100%   BMI 20.26 kg/m    Subjective:    Patient ID: Wanda Hudson, female    DOB: 1970-12-28, 46 y.o.   MRN: [id number]  HPI: [name] is a 46 y.o. female  Chief Complaint  Patient presents with  . Annual Exam   States her ear is infected.  Multple surgeries on that ear and doesn't want to go to ENT.    Depression Doing well on took herself of of all medications for depression and anxiety Depression screen [facility] 2/9 05/17/2017 05/14/2016 05/12/2015  Decreased Interest 0 0 0  Down, Depressed, Hopeless 0 0 0  PHQ - 2 Score 0 0 0  Altered sleeping 0 1 -  Tired, decreased energy 0 0 -  Change in appetite 0 1 -  Feeling bad or failure about yourself  0 0 -  Trouble concentrating 0 0 -  Moving slowly or fidgety/restless 0 0 -  Suicidal thoughts 0 0 -  PHQ-9 Score 0 2 -   Hypertension This is the second time it has been high.  No SOB or chest pain.  Does not check outside the office.    Smoking 1/2 ppd but not ready to quit.      Social History   Social History  . Marital status: Single    Spouse name: N/A  . Number of children: N/A  . Years of education: N/A   Occupational History  . Not on file.   Social History Main Topics  . Smoking status: Current Some Day Smoker    Packs/day: 0.50    Years: 21.00    Types: Cigarettes  . Smokeless tobacco: Never Used  . Alcohol use No     Comment: occasionally  . Drug use: No  . Sexual activity: Yes    Birth control/ protection: Injection   Other Topics Concern  . Not on file   Social History Narrative  . No narrative on file   Family History  Problem Relation Age of Onset  . Diabetes Mother   . Hypertension Mother   . Stroke Mother   . Diabetes Father   . Hypertension Father   . Heart disease Maternal Grandfather        MI  .  Heart disease Paternal Grandfather        MI  . Hypertension Brother   . Aneurysm Maternal Grandmother    Past Medical History:  Diagnosis Date  . Anxiety   . Depression   . Insomnia   . Osteopenia   . Peripheral neuropathy    Past Surgical History:  Procedure Laterality Date  . 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 DG SALIVARY GLANDS ([place] HX)    . TUMOR REMOVAL     ears     Relevant past medical, surgical, family and social history reviewed and updated as indicated. Interim medical history since our last visit reviewed. Allergies and medications reviewed and updated.  Review of Systems  Neurological:       Pt with numbness bilateral feet improved with B12    Per HPI unless specifically indicated above     Objective:    BP (!) 167/99 (BP Location: Left Arm, Cuff Size:  Small)   Pulse 76   Temp 98.4 F (36.9 C)   Ht 5' 5.2" (1.656 m)   Wt 122 lb 8 oz (55.6 kg)   LMP  (LMP Unknown)   SpO2 100%   BMI 20.26 kg/m   Wt Readings from Last 3 Encounters:  05/17/17 122 lb 8 oz (55.6 kg)  05/27/16 122 lb (55.3 kg)  05/14/16 123 lb 9.6 oz (56.1 kg)    Physical Exam  Constitutional: She is oriented to person, place, and time. She appears well-developed and well-nourished.  HENT:  Head: Normocephalic and atraumatic.  Right ear with canal swelling and post surgical changes  Eyes: Pupils are equal, round, and reactive to light. Right eye exhibits no discharge. Left eye exhibits no discharge. No scleral icterus.  Neck: Normal range of motion. Neck supple. Carotid bruit is not present. No thyromegaly present.  Cardiovascular: Normal rate, regular rhythm and normal heart sounds.  Exam reveals no gallop and no friction rub.   No murmur heard. Pulmonary/Chest: Effort normal and breath sounds normal. No respiratory distress. She has no wheezes. She has no rales.  Abdominal: Soft. Bowel sounds are normal. There is no tenderness. There is no rebound.  Genitourinary: No breast  swelling, tenderness or discharge.  Musculoskeletal: Normal range of motion.  Lymphadenopathy:    She has no cervical adenopathy.  Neurological: She is alert and oriented to person, place, and time.  Skin: Skin is warm, dry and intact. No rash noted.  Psychiatric: She has a normal mood and affect. Her speech is normal and behavior is normal. Judgment and thought content normal. Cognition and memory are normal.    Results for orders placed or performed in visit on 05/14/16  CBC with Differential/Platelet  Result Value Ref Range   WBC 10.6 3.4 - 10.8 x10E3/uL   RBC 4.73 3.77 - 5.28 x10E6/uL   Hemoglobin 13.4 11.1 - 15.9 g/dL   Hematocrit 40.8 34.0 - 46.6 %   MCV 86 79 - 97 fL   MCH 28.3 26.6 - 33.0 pg   MCHC 32.8 31.5 - 35.7 g/dL   RDW 13.4 12.3 - 15.4 %   Platelets 346 150 - 379 x10E3/uL   Neutrophils 68 %   Lymphs 22 %   Monocytes 9 %   Eos 1 %   Basos 0 %   Neutrophils Absolute 7.1 (H) 1.4 - 7.0 x10E3/uL   Lymphocytes Absolute 2.3 0.7 - 3.1 x10E3/uL   Monocytes Absolute 1.0 (H) 0.1 - 0.9 x10E3/uL   EOS (ABSOLUTE) 0.1 0.0 - 0.4 x10E3/uL   Basophils Absolute 0.0 0.0 - 0.2 x10E3/uL   Immature Granulocytes 0 %   Immature Grans (Abs) 0.0 0.0 - 0.1 x10E3/uL  Comprehensive metabolic panel  Result Value Ref Range   Glucose 91 65 - 99 mg/dL   BUN 11 6 - 24 mg/dL   Creatinine, Ser 0.57 0.57 - 1.00 mg/dL   GFR calc non Af Amer 111 >59 mL/min/1.73   GFR calc Af Amer 128 >59 mL/min/1.73   BUN/Creatinine Ratio 19 9 - 23   Sodium 139 134 - 144 mmol/L   Potassium 4.0 3.5 - 5.2 mmol/L   Chloride 101 96 - 106 mmol/L   CO2 22 18 - 29 mmol/L   Calcium 10.0 8.7 - 10.2 mg/dL   Total Protein 7.3 6.0 - 8.5 g/dL   Albumin 4.5 3.5 - 5.5 g/dL   Globulin, Total 2.8 1.5 - 4.5 g/dL   Albumin/Globulin Ratio 1.6 1.2 - 2.2  Bilirubin Total 0.3 0.0 - 1.2 mg/dL   Alkaline Phosphatase 65 39 - 117 IU/L   AST 15 0 - 40 IU/L   ALT 6 0 - 32 IU/L  Lipid Panel w/o Chol/HDL Ratio  Result Value Ref  Range   Cholesterol, Total 165 100 - 199 mg/dL   Triglycerides 99 0 - 149 mg/dL   HDL 43 >39 mg/dL   VLDL Cholesterol Cal 20 5 - 40 mg/dL   LDL Calculated 102 (H) 0 - 99 mg/dL  TSH  Result Value Ref Range   TSH 1.010 0.450 - 4.500 uIU/mL  IGP, Aptima HPV, rfx 16/18,45  Result Value Ref Range   DIAGNOSIS: Comment    Specimen adequacy: Comment    Clinician Provided ICD10 Comment    Performed by: Comment    PAP Smear Comment .    Note: Comment    Test Methodology Comment    HPV Aptima Negative Negative      Assessment & Plan:   Problem List Items Addressed This Visit      Unprioritized   Benign hypertension    New problem.  Will start HCTZ. 25 mg.        Relevant Medications   hydrochlorothiazide (HYDRODIURIL) 25 MG tablet   Otitis externa    Corticosporin ear drops      Peripheral neuropathy    Improved with B12.  Check today      Relevant Orders   Vitamin B12    Other Visit Diagnoses    Annual physical exam    -  Primary   Relevant Orders   CBC with Differential/Platelet   Comprehensive metabolic panel   Lipid Panel w/o Chol/HDL Ratio   TSH       Follow up plan: Return in about 4 weeks (around 06/14/2017).

## 2017-05-18 ENCOUNTER — Encounter: Payer: Self-pay | Admitting: Unknown Physician Specialty

## 2017-05-18 LAB — COMPREHENSIVE METABOLIC PANEL
A/G RATIO: 1.7 (ref 1.2–2.2)
ALK PHOS: 86 IU/L (ref 39–117)
ALT: 10 IU/L (ref 0–32)
AST: 16 IU/L (ref 0–40)
Albumin: 4.3 g/dL (ref 3.5–5.5)
BUN/Creatinine Ratio: 19 (ref 9–23)
BUN: 13 mg/dL (ref 6–24)
Bilirubin Total: 0.3 mg/dL (ref 0.0–1.2)
CO2: 19 mmol/L — AB (ref 20–29)
CREATININE: 0.7 mg/dL (ref 0.57–1.00)
Calcium: 9.3 mg/dL (ref 8.7–10.2)
Chloride: 103 mmol/L (ref 96–106)
GFR calc Af Amer: 118 mL/min/{1.73_m2} (ref >59)
GFR calc non Af Amer: 103 mL/min/{1.73_m2} (ref >59)
GLOBULIN, TOTAL: 2.6 g/dL (ref 1.5–4.5)
Glucose: 66 mg/dL (ref 65–99)
POTASSIUM: 3.7 mmol/L (ref 3.5–5.2)
SODIUM: 141 mmol/L (ref 134–144)
Total Protein: 6.9 g/dL (ref 6.0–8.5)

## 2017-05-18 LAB — CBC WITH DIFFERENTIAL/PLATELET
Basophils Absolute: 0 10*3/uL (ref 0.0–0.2)
Basos: 1 %
EOS (ABSOLUTE): 0.2 10*3/uL (ref 0.0–0.4)
EOS: 2 %
HEMATOCRIT: 42.4 % (ref 34.0–46.6)
Hemoglobin: 13.3 g/dL (ref 11.1–15.9)
Immature Grans (Abs): 0 10*3/uL (ref 0.0–0.1)
Immature Granulocytes: 0 %
LYMPHS ABS: 3.6 10*3/uL — AB (ref 0.7–3.1)
Lymphs: 42 %
MCH: 28.3 pg (ref 26.6–33.0)
MCHC: 31.4 g/dL — AB (ref 31.5–35.7)
MCV: 90 fL (ref 79–97)
MONOS ABS: 1 10*3/uL — AB (ref 0.1–0.9)
Monocytes: 11 %
Neutrophils Absolute: 3.8 10*3/uL (ref 1.4–7.0)
Neutrophils: 44 %
PLATELETS: 356 10*3/uL (ref 150–379)
RBC: 4.7 x10E6/uL (ref 3.77–5.28)
RDW: 13.9 % (ref 12.3–15.4)
WBC: 8.7 10*3/uL (ref 3.4–10.8)

## 2017-05-18 LAB — TSH: TSH: 1.55 u[IU]/mL (ref 0.450–4.500)

## 2017-05-18 LAB — LIPID PANEL W/O CHOL/HDL RATIO
CHOLESTEROL TOTAL: 163 mg/dL (ref 100–199)
HDL: 43 mg/dL (ref >39)
LDL Calculated: 92 mg/dL (ref 0–99)
TRIGLYCERIDES: 139 mg/dL (ref 0–149)
VLDL Cholesterol Cal: 28 mg/dL (ref 5–40)

## 2017-05-18 LAB — VITAMIN B12: VITAMIN B 12: 282 pg/mL (ref 232–1245)

## 2017-06-07 ENCOUNTER — Ambulatory Visit (INDEPENDENT_AMBULATORY_CARE_PROVIDER_SITE_OTHER): Payer: 59

## 2017-06-07 DIAGNOSIS — Z3042 Encounter for surveillance of injectable contraceptive: Secondary | ICD-10-CM

## 2017-06-07 MED ORDER — MEDROXYPROGESTERONE ACETATE 150 MG/ML IM SUSP
150.0000 mg | Freq: Once | INTRAMUSCULAR | Status: AC
  Administered 2017-06-07: 150 mg via INTRAMUSCULAR

## 2017-06-15 ENCOUNTER — Ambulatory Visit (INDEPENDENT_AMBULATORY_CARE_PROVIDER_SITE_OTHER): Payer: 59 | Admitting: Unknown Physician Specialty

## 2017-06-15 ENCOUNTER — Encounter: Payer: Self-pay | Admitting: Unknown Physician Specialty

## 2017-06-15 DIAGNOSIS — I10 Essential (primary) hypertension: Secondary | ICD-10-CM

## 2017-06-15 MED ORDER — HYDROCHLOROTHIAZIDE 25 MG PO TABS: 25.0000 mg | ORAL_TABLET | Freq: Every day | ORAL | 1 refills | Status: DC

## 2017-06-15 NOTE — Progress Notes (Signed)
BP 137/78   Pulse 96   Temp 98.4 F (36.9 C)   Wt 122 lb (55.3 kg)   LMP  (LMP Unknown)   SpO2 99%   BMI 20.18 kg/m    Subjective:    Patient ID: Wanda Hudson, female    DOB: 08/10/1971, 46 y.o.   MRN: [id number]  HPI: Wanda Hudson is a 46 y.o. female  Chief Complaint  Patient presents with  . Hypertension    4 week f/up    Hypertension Started on HCTZ last visit.  Using medications without difficulty.  Average home BPs about what it is here   No problems or lightheadedness No chest pain with exertion or shortness of breath No Edema   Relevant past medical, surgical, family and social history reviewed and updated as indicated. Interim medical history since our last visit reviewed. Allergies and medications reviewed and updated.  Review of Systems  Per HPI unless specifically indicated above     Objective:    BP 137/78   Pulse 96   Temp 98.4 F (36.9 C)   Wt 122 lb (55.3 kg)   LMP  (LMP Unknown)   SpO2 99%   BMI 20.18 kg/m   Wt Readings from Last 3 Encounters:  06/15/17 122 lb (55.3 kg)  05/17/17 122 lb 8 oz (55.6 kg)  05/27/16 122 lb (55.3 kg)    Physical Exam  Constitutional: She is oriented to person, place, and time. She appears well-developed and well-nourished. No distress.  HENT:  Head: Normocephalic and atraumatic.  Eyes: Conjunctivae and lids are normal. Right eye exhibits no discharge. Left eye exhibits no discharge. No scleral icterus.  Neck: Normal range of motion. Neck supple. No JVD present. Carotid bruit is not present.  Cardiovascular: Normal rate, regular rhythm and normal heart sounds.   Pulmonary/Chest: Effort normal and breath sounds normal.  Abdominal: Normal appearance. There is no splenomegaly or hepatomegaly.  Musculoskeletal: Normal range of motion.  Neurological: She is alert and oriented to person, place, and time.  Skin: Skin is warm, dry and intact. No rash noted. No pallor.  Psychiatric: She has a normal mood and  affect. Her behavior is normal. Judgment and thought content normal.    Results for orders placed or performed in visit on 05/17/17  CBC with Differential/Platelet  Result Value Ref Range   WBC 8.7 3.4 - 10.8 x10E3/uL   RBC 4.70 3.77 - 5.28 x10E6/uL   Hemoglobin 13.3 11.1 - 15.9 g/dL   Hematocrit 42.4 34.0 - 46.6 %   MCV 90 79 - 97 fL   MCH 28.3 26.6 - 33.0 pg   MCHC 31.4 (L) 31.5 - 35.7 g/dL   RDW 13.9 12.3 - 15.4 %   Platelets 356 150 - 379 x10E3/uL   Neutrophils 44 Not Estab. %   Lymphs 42 Not Estab. %   Monocytes 11 Not Estab. %   Eos 2 Not Estab. %   Basos 1 Not Estab. %   Neutrophils Absolute 3.8 1.4 - 7.0 x10E3/uL   Lymphocytes Absolute 3.6 (H) 0.7 - 3.1 x10E3/uL   Monocytes Absolute 1.0 (H) 0.1 - 0.9 x10E3/uL   EOS (ABSOLUTE) 0.2 0.0 - 0.4 x10E3/uL   Basophils Absolute 0.0 0.0 - 0.2 x10E3/uL   Immature Granulocytes 0 Not Estab. %   Immature Grans (Abs) 0.0 0.0 - 0.1 x10E3/uL  Comprehensive metabolic panel  Result Value Ref Range   Glucose 66 65 - 99 mg/dL   BUN 13 6 - 24  mg/dL   Creatinine, Ser 0.70 0.57 - 1.00 mg/dL   GFR calc non Af Amer 103 >59 mL/min/1.73   GFR calc Af Amer 118 >59 mL/min/1.73   BUN/Creatinine Ratio 19 9 - 23   Sodium 141 134 - 144 mmol/L   Potassium 3.7 3.5 - 5.2 mmol/L   Chloride 103 96 - 106 mmol/L   CO2 19 (L) 20 - 29 mmol/L   Calcium 9.3 8.7 - 10.2 mg/dL   Total Protein 6.9 6.0 - 8.5 g/dL   Albumin 4.3 3.5 - 5.5 g/dL   Globulin, Total 2.6 1.5 - 4.5 g/dL   Albumin/Globulin Ratio 1.7 1.2 - 2.2   Bilirubin Total 0.3 0.0 - 1.2 mg/dL   Alkaline Phosphatase 86 39 - 117 IU/L   AST 16 0 - 40 IU/L   ALT 10 0 - 32 IU/L  Lipid Panel w/o Chol/HDL Ratio  Result Value Ref Range   Cholesterol, Total 163 100 - 199 mg/dL   Triglycerides 139 0 - 149 mg/dL   HDL 43 >39 mg/dL   VLDL Cholesterol Cal 28 5 - 40 mg/dL   LDL Calculated 92 0 - 99 mg/dL  TSH  Result Value Ref Range   TSH 1.550 0.450 - 4.500 uIU/mL  Vitamin B12  Result Value Ref Range     Vitamin B-12 282 232 - 1,245 pg/mL      Assessment & Plan:   Problem List Items Addressed This Visit      Unprioritized   Benign hypertension    Stable, continue present medications.        Relevant Medications   hydrochlorothiazide (HYDRODIURIL) 25 MG tablet       Follow up plan: Return in about 6 months (around 12/13/2017).

## 2017-06-15 NOTE — Assessment & Plan Note (Signed)
Stable, continue present medications.   

## 2017-08-24 ENCOUNTER — Ambulatory Visit (INDEPENDENT_AMBULATORY_CARE_PROVIDER_SITE_OTHER): Payer: 59 | Admitting: Unknown Physician Specialty

## 2017-08-24 ENCOUNTER — Ambulatory Visit: Payer: 59

## 2017-08-24 ENCOUNTER — Encounter: Payer: Self-pay | Admitting: Unknown Physician Specialty

## 2017-08-24 VITALS — BP 138/86 | HR 81 | Temp 98.4°F | Wt 124.8 lb

## 2017-08-24 DIAGNOSIS — Z3042 Encounter for surveillance of injectable contraceptive: Secondary | ICD-10-CM

## 2017-08-24 DIAGNOSIS — J069 Acute upper respiratory infection, unspecified: Secondary | ICD-10-CM | POA: Diagnosis not present

## 2017-08-24 MED ORDER — MEDROXYPROGESTERONE ACETATE 150 MG/ML IM SUSP
150.0000 mg | INTRAMUSCULAR | Status: AC
  Administered 2017-08-24 – 2018-04-28 (×4): 150 mg via INTRAMUSCULAR

## 2017-08-24 NOTE — Progress Notes (Signed)
BP 138/86   Pulse 81   Temp 98.4 F (36.9 C) (Oral)   Wt 124 lb 12.8 oz (56.6 kg)   SpO2 98%   BMI 20.64 kg/m    Subjective:    Patient ID: Wanda Hudson, female    DOB: [birth year], 47 y.o.   MRN: [id number]  HPI: JARA FEIDER is a 47 y.o. female  Chief Complaint  Patient presents with  . URI    pt states she has had congestion, sinus pressure, headache, and a cough since Saturday    URI   This is a new problem. Episode onset: 4 days. Associated symptoms include congestion, coughing, headaches, nausea, rhinorrhea, sinus pain, sneezing and a sore throat. Pertinent negatives include no abdominal pain, chest pain, diarrhea, dysuria, ear pain, joint pain, joint swelling, neck pain, plugged ear sensation, rash, swollen glands, vomiting or wheezing. She has tried decongestant and antihistamine for the symptoms. The treatment provided no relief.   Needs Depo Taking to control menses.  Doing well.  No menses in quite some time.    Relevant past medical, surgical, family and social history reviewed and updated as indicated. Interim medical history since our last visit reviewed. Allergies and medications reviewed and updated.  Review of Systems  HENT: Positive for congestion, rhinorrhea, sinus pain, sneezing and sore throat. Negative for ear pain.   Respiratory: Positive for cough. Negative for wheezing.   Cardiovascular: Negative for chest pain.  Gastrointestinal: Positive for nausea. Negative for abdominal pain, diarrhea and vomiting.  Genitourinary: Negative for dysuria.  Musculoskeletal: Negative for joint pain and neck pain.  Skin: Negative for rash.  Neurological: Positive for headaches.    Per HPI unless specifically indicated above     Objective:    BP 138/86   Pulse 81   Temp 98.4 F (36.9 C) (Oral)   Wt 124 lb 12.8 oz (56.6 kg)   SpO2 98%   BMI 20.64 kg/m   Wt Readings from Last 3 Encounters:  08/24/17 124 lb 12.8 oz (56.6 kg)  06/15/17 122 lb (55.3  kg)  05/17/17 122 lb 8 oz (55.6 kg)    Physical Exam  Constitutional: She is oriented to person, place, and time. She appears well-developed and well-nourished. No distress.  HENT:  Head: Normocephalic and atraumatic.  Right Ear: Tympanic membrane and ear canal normal.  Left Ear: Tympanic membrane and ear canal normal.  Nose: Rhinorrhea present. Right sinus exhibits no maxillary sinus tenderness and no frontal sinus tenderness. Left sinus exhibits no maxillary sinus tenderness and no frontal sinus tenderness.  Mouth/Throat: Mucous membranes are normal. Posterior oropharyngeal erythema present.  Eyes: Conjunctivae and lids are normal. Right eye exhibits no discharge. Left eye exhibits no discharge. No scleral icterus.  Cardiovascular: Normal rate and regular rhythm.  Pulmonary/Chest: Effort normal and breath sounds normal. No respiratory distress.  Abdominal: Normal appearance. There is no splenomegaly or hepatomegaly.  Musculoskeletal: Normal range of motion.  Neurological: She is alert and oriented to person, place, and time.  Skin: Skin is intact. No rash noted. No pallor.  Psychiatric: She has a normal mood and affect. Her behavior is normal. Judgment and thought content normal.    Results for orders placed or performed in visit on 05/17/17  CBC with Differential/Platelet  Result Value Ref Range   WBC 8.7 3.4 - 10.8 x10E3/uL   RBC 4.70 3.77 - 5.28 x10E6/uL   Hemoglobin 13.3 11.1 - 15.9 g/dL   Hematocrit 42.4 34.0 - 46.6 %  MCV 90 79 - 97 fL   MCH 28.3 26.6 - 33.0 pg   MCHC 31.4 (L) 31.5 - 35.7 g/dL   RDW 13.9 12.3 - 15.4 %   Platelets 356 150 - 379 x10E3/uL   Neutrophils 44 Not Estab. %   Lymphs 42 Not Estab. %   Monocytes 11 Not Estab. %   Eos 2 Not Estab. %   Basos 1 Not Estab. %   Neutrophils Absolute 3.8 1.4 - 7.0 x10E3/uL   Lymphocytes Absolute 3.6 (H) 0.7 - 3.1 x10E3/uL   Monocytes Absolute 1.0 (H) 0.1 - 0.9 x10E3/uL   EOS (ABSOLUTE) 0.2 0.0 - 0.4 x10E3/uL    Basophils Absolute 0.0 0.0 - 0.2 x10E3/uL   Immature Granulocytes 0 Not Estab. %   Immature Grans (Abs) 0.0 0.0 - 0.1 x10E3/uL  Comprehensive metabolic panel  Result Value Ref Range   Glucose 66 65 - 99 mg/dL   BUN 13 6 - 24 mg/dL   Creatinine, Ser 0.70 0.57 - 1.00 mg/dL   GFR calc non Af Amer 103 >59 mL/min/1.73   GFR calc Af Amer 118 >59 mL/min/1.73   BUN/Creatinine Ratio 19 9 - 23   Sodium 141 134 - 144 mmol/L   Potassium 3.7 3.5 - 5.2 mmol/L   Chloride 103 96 - 106 mmol/L   CO2 19 (L) 20 - 29 mmol/L   Calcium 9.3 8.7 - 10.2 mg/dL   Total Protein 6.9 6.0 - 8.5 g/dL   Albumin 4.3 3.5 - 5.5 g/dL   Globulin, Total 2.6 1.5 - 4.5 g/dL   Albumin/Globulin Ratio 1.7 1.2 - 2.2   Bilirubin Total 0.3 0.0 - 1.2 mg/dL   Alkaline Phosphatase 86 39 - 117 IU/L   AST 16 0 - 40 IU/L   ALT 10 0 - 32 IU/L  Lipid Panel w/o Chol/HDL Ratio  Result Value Ref Range   Cholesterol, Total 163 100 - 199 mg/dL   Triglycerides 139 0 - 149 mg/dL   HDL 43 >39 mg/dL   VLDL Cholesterol Cal 28 5 - 40 mg/dL   LDL Calculated 92 0 - 99 mg/dL  TSH  Result Value Ref Range   TSH 1.550 0.450 - 4.500 uIU/mL  Vitamin B12  Result Value Ref Range   Vitamin B-12 282 232 - 1,245 pg/mL      Assessment & Plan:   Problem List Items Addressed This Visit    None    Visit Diagnoses    Encounter for surveillance of injectable contraceptive    -  Primary   Relevant Medications   medroxyPROGESTERone (DEPO-PROVERA) injection 150 mg   Viral upper respiratory tract infection       Saline nasal sprays.  OTC pseudophed.  Lots of rest and fluids       Follow up plan: Return if symptoms worsen or fail to improve.

## 2017-11-09 ENCOUNTER — Ambulatory Visit: Payer: 59

## 2017-11-11 ENCOUNTER — Ambulatory Visit (INDEPENDENT_AMBULATORY_CARE_PROVIDER_SITE_OTHER): Payer: 59

## 2017-11-11 DIAGNOSIS — Z3042 Encounter for surveillance of injectable contraceptive: Secondary | ICD-10-CM

## 2017-12-14 ENCOUNTER — Encounter: Payer: Self-pay | Admitting: Unknown Physician Specialty

## 2017-12-14 ENCOUNTER — Ambulatory Visit (INDEPENDENT_AMBULATORY_CARE_PROVIDER_SITE_OTHER): Payer: 59 | Admitting: Unknown Physician Specialty

## 2017-12-14 DIAGNOSIS — I10 Essential (primary) hypertension: Secondary | ICD-10-CM

## 2017-12-14 MED ORDER — HYDROCHLOROTHIAZIDE 25 MG PO TABS: 25.0000 mg | ORAL_TABLET | Freq: Every day | ORAL | 1 refills | Status: DC

## 2017-12-14 NOTE — Assessment & Plan Note (Signed)
Stable, continue present medications.   

## 2017-12-14 NOTE — Progress Notes (Signed)
BP 131/83 (BP Location: Left Arm, Cuff Size: Small)   Pulse 93   Temp 98.2 F (36.8 C) (Oral)   Ht 5\' 5"  (1.651 m)   Wt 124 lb (56.2 kg)   SpO2 99%   BMI 20.63 kg/m    Subjective:    Patient ID: Wanda Hudson, female    DOB: 1970/12/24, 47 y.o.   MRN: [id number]  HPI: Wanda Hudson is a 47 y.o. female  Chief Complaint  Patient presents with  . Hypertension   Hypertension Using medications without difficulty Average home BPs Not checking at home but does on the job.  Typically around 150/80   No problems or lightheadedness No chest pain with exertion or shortness of breath No Edema  Relevant past medical, surgical, family and social history reviewed and updated as indicated. Interim medical history since our last visit reviewed. Allergies and medications reviewed and updated.  Review of Systems  Per HPI unless specifically indicated above     Objective:    BP 131/83 (BP Location: Left Arm, Cuff Size: Small)   Pulse 93   Temp 98.2 F (36.8 C) (Oral)   Ht 5\' 5"  (1.651 m)   Wt 124 lb (56.2 kg)   SpO2 99%   BMI 20.63 kg/m   Wt Readings from Last 3 Encounters:  12/14/17 124 lb (56.2 kg)  08/24/17 124 lb 12.8 oz (56.6 kg)  06/15/17 122 lb (55.3 kg)    Physical Exam  Constitutional: She is oriented to person, place, and time. She appears well-developed and well-nourished. No distress.  HENT:  Head: Normocephalic and atraumatic.  Eyes: Conjunctivae and lids are normal. Right eye exhibits no discharge. Left eye exhibits no discharge. No scleral icterus.  Neck: Normal range of motion. Neck supple. No JVD present. Carotid bruit is not present.  Cardiovascular: Normal rate, regular rhythm and normal heart sounds.  Pulmonary/Chest: Effort normal and breath sounds normal.  Abdominal: Normal appearance. There is no splenomegaly or hepatomegaly.  Musculoskeletal: Normal range of motion.  Neurological: She is alert and oriented to person, place, and time.  Skin:  Skin is warm, dry and intact. No rash noted. No pallor.  Psychiatric: She has a normal mood and affect. Her behavior is normal. Judgment and thought content normal.    Results for orders placed or performed in visit on 05/17/17  CBC with Differential/Platelet  Result Value Ref Range   WBC 8.7 3.4 - 10.8 x10E3/uL   RBC 4.70 3.77 - 5.28 x10E6/uL   Hemoglobin 13.3 11.1 - 15.9 g/dL   Hematocrit 42.4 34.0 - 46.6 %   MCV 90 79 - 97 fL   MCH 28.3 26.6 - 33.0 pg   MCHC 31.4 (L) 31.5 - 35.7 g/dL   RDW 13.9 12.3 - 15.4 %   Platelets 356 150 - 379 x10E3/uL   Neutrophils 44 Not Estab. %   Lymphs 42 Not Estab. %   Monocytes 11 Not Estab. %   Eos 2 Not Estab. %   Basos 1 Not Estab. %   Neutrophils Absolute 3.8 1.4 - 7.0 x10E3/uL   Lymphocytes Absolute 3.6 (H) 0.7 - 3.1 x10E3/uL   Monocytes Absolute 1.0 (H) 0.1 - 0.9 x10E3/uL   EOS (ABSOLUTE) 0.2 0.0 - 0.4 x10E3/uL   Basophils Absolute 0.0 0.0 - 0.2 x10E3/uL   Immature Granulocytes 0 Not Estab. %   Immature Grans (Abs) 0.0 0.0 - 0.1 x10E3/uL  Comprehensive metabolic panel  Result Value Ref Range   Glucose 66 65 -  99 mg/dL   BUN 13 6 - 24 mg/dL   Creatinine, Ser 0.70 0.57 - 1.00 mg/dL   GFR calc non Af Amer 103 >59 mL/min/1.73   GFR calc Af Amer 118 >59 mL/min/1.73   BUN/Creatinine Ratio 19 9 - 23   Sodium 141 134 - 144 mmol/L   Potassium 3.7 3.5 - 5.2 mmol/L   Chloride 103 96 - 106 mmol/L   CO2 19 (L) 20 - 29 mmol/L   Calcium 9.3 8.7 - 10.2 mg/dL   Total Protein 6.9 6.0 - 8.5 g/dL   Albumin 4.3 3.5 - 5.5 g/dL   Globulin, Total 2.6 1.5 - 4.5 g/dL   Albumin/Globulin Ratio 1.7 1.2 - 2.2   Bilirubin Total 0.3 0.0 - 1.2 mg/dL   Alkaline Phosphatase 86 39 - 117 IU/L   AST 16 0 - 40 IU/L   ALT 10 0 - 32 IU/L  Lipid Panel w/o Chol/HDL Ratio  Result Value Ref Range   Cholesterol, Total 163 100 - 199 mg/dL   Triglycerides 139 0 - 149 mg/dL   HDL 43 >39 mg/dL   VLDL Cholesterol Cal 28 5 - 40 mg/dL   LDL Calculated 92 0 - 99 mg/dL  TSH    Result Value Ref Range   TSH 1.550 0.450 - 4.500 uIU/mL  Vitamin B12  Result Value Ref Range   Vitamin B-12 282 232 - 1,245 pg/mL      Assessment & Plan:   Problem List Items Addressed This Visit      Unprioritized   Benign hypertension    Stable, continue present medications.        Relevant Medications   hydrochlorothiazide (HYDRODIURIL) 25 MG tablet       Follow up plan: Return in about 6 months (around 06/16/2018) for physical.

## 2018-01-30 ENCOUNTER — Ambulatory Visit: Payer: 59

## 2018-01-31 ENCOUNTER — Ambulatory Visit (INDEPENDENT_AMBULATORY_CARE_PROVIDER_SITE_OTHER): Payer: 59

## 2018-01-31 DIAGNOSIS — Z3042 Encounter for surveillance of injectable contraceptive: Secondary | ICD-10-CM

## 2018-04-28 ENCOUNTER — Ambulatory Visit: Payer: 59

## 2018-04-28 ENCOUNTER — Ambulatory Visit (INDEPENDENT_AMBULATORY_CARE_PROVIDER_SITE_OTHER): Payer: 59

## 2018-04-28 DIAGNOSIS — Z3042 Encounter for surveillance of injectable contraceptive: Secondary | ICD-10-CM | POA: Diagnosis not present

## 2018-05-26 ENCOUNTER — Encounter: Payer: 59 | Admitting: Unknown Physician Specialty

## 2018-06-16 ENCOUNTER — Other Ambulatory Visit (HOSPITAL_COMMUNITY)
Admission: RE | Admit: 2018-06-16 | Discharge: 2018-06-16 | Disposition: A | Discharge status: Home / Self Care | Payer: 59 | Source: Ambulatory Visit | Attending: Unknown Physician Specialty | Admitting: Unknown Physician Specialty

## 2018-06-16 ENCOUNTER — Ambulatory Visit (INDEPENDENT_AMBULATORY_CARE_PROVIDER_SITE_OTHER): Payer: 59 | Admitting: Unknown Physician Specialty

## 2018-06-16 ENCOUNTER — Encounter: Payer: Self-pay | Admitting: Unknown Physician Specialty

## 2018-06-16 VITALS — BP 129/79 | HR 88 | Temp 97.8°F | Ht 66.34 in | Wt 126.0 lb

## 2018-06-16 DIAGNOSIS — I10 Essential (primary) hypertension: Secondary | ICD-10-CM

## 2018-06-16 DIAGNOSIS — Z8742 Personal history of other diseases of the female genital tract: Secondary | ICD-10-CM | POA: Insufficient documentation

## 2018-06-16 DIAGNOSIS — K6289 Other specified diseases of anus and rectum: Secondary | ICD-10-CM

## 2018-06-16 DIAGNOSIS — Z0000 Encounter for general adult medical examination without abnormal findings: Secondary | ICD-10-CM

## 2018-06-16 MED ORDER — HYDROCHLOROTHIAZIDE 25 MG PO TABS: 25.0000 mg | ORAL_TABLET | Freq: Every day | ORAL | 1 refills | Status: DC

## 2018-06-16 MED ORDER — AMLODIPINE BESYLATE 5 MG PO TABS: 5.0000 mg | ORAL_TABLET | Freq: Every day | ORAL | 3 refills | Status: DC

## 2018-06-16 NOTE — Progress Notes (Signed)
BP 129/79   Pulse 88   Temp 97.8 F (36.6 C) (Oral)   Ht 5' 6.34" (1.685 m)   Wt 126 lb (57.2 kg)   BMI 20.13 kg/m    Subjective:    Patient ID: Wanda Hudson, female    DOB: 01-Apr-1971, 47 y.o.   MRN: [id number]  HPI: Wanda Hudson is a 47 y.o. female  Chief Complaint  Patient presents with  . Annual Exam   Hypertension Using medications without difficulty.  However, had a swollen cervical gland.  Saw Dr. Tami Ribas at ENT who suggested it may be due to BP medication "taking too much fluid out of me"  Wants to change BP meds Average home BPs Not checking   No problems or lightheadedness No chest pain with exertion or shortness of breath No Edema  The 10-year ASCVD risk score Mikey Bussing DC Brooke Bonito., et al., 2013) is: 7.7%   Values used to calculate the score:     Age: 76 years     Sex: Female     Is Non-Hispanic African American: Yes     Diabetic: No     Tobacco smoker: Yes     Systolic Blood Pressure: 983 mmHg     Is BP treated: Yes     HDL Cholesterol: 43 mg/dL     Total Cholesterol: 163 mg/dL   Depression screen [facility] 2/9 06/16/2018 05/17/2017 05/14/2016 05/12/2015  Decreased Interest 0 0 0 0  Down, Depressed, Hopeless 0 0 0 0  PHQ - 2 Score 0 0 0 0  Altered sleeping 0 0 1 -  Tired, decreased energy 0 0 0 -  Change in appetite 0 0 1 -  Feeling bad or failure about yourself  0 0 0 -  Trouble concentrating 0 0 0 -  Moving slowly or fidgety/restless 0 0 0 -  Suicidal thoughts 0 0 0 -  PHQ-9 Score 0 0 2 -   Past Medical History:  Diagnosis Date  . Anxiety   . Depression   . Insomnia   . Osteopenia   . Peripheral neuropathy    Social History   Socioeconomic History  . Marital status: Single    Spouse name: Not on file  . Number of children: Not on file  . Years of education: Not on file  . Highest education level: Not on file  Occupational History  . Not on file  Social Needs  . Financial resource strain: Not on file  . Food insecurity:    Worry: Not on  file    Inability: Not on file  . Transportation needs:    Medical: Not on file    Non-medical: Not on file  Tobacco Use  . Smoking status: Current Some Day Smoker    Packs/day: 0.50    Years: 21.00    Pack years: 10.50    Types: Cigarettes  . Smokeless tobacco: Never Used  Substance and Sexual Activity  . Alcohol use: No    Comment: occasionally  . Drug use: No  . Sexual activity: Yes    Birth control/protection: Injection  Lifestyle  . Physical activity:    Days per week: Not on file    Minutes per session: Not on file  . Stress: Not on file  Relationships  . Social connections:    Talks on phone: Not on file    Gets together: Not on file    Attends religious service: Not on file    Active member of club  or organization: Not on file    Attends meetings of clubs or organizations: Not on file    Relationship status: Not on file  Other Topics Concern  . Not on file  Social History Narrative  . Not on file    Family History  Problem Relation Age of Onset  . Diabetes Mother   . Hypertension Mother   . Stroke Mother   . Diabetes Father   . Hypertension Father   . Heart disease Maternal Grandfather        MI  . Heart disease Paternal Grandfather        MI  . Hypertension Brother   . Aneurysm Maternal Grandmother    Past Surgical History:  Procedure Laterality Date  . 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 DG SALIVARY GLANDS ([place] HX)    . TUMOR REMOVAL     ears   Relevant past medical, surgical, family and social history reviewed and updated as indicated. Interim medical history since our last visit reviewed. Allergies and medications reviewed and updated.  Review of Systems  Constitutional: Negative.   HENT: Negative.   Eyes: Negative.   Respiratory: Negative.   Cardiovascular: Negative.   Gastrointestinal: Positive for anal bleeding.       States she has a nodule around her anus which seems to be getting bigger and bleeding  Endocrine: Negative.     Genitourinary: Negative.   Musculoskeletal: Negative.   Skin: Negative.   Allergic/Immunologic: Negative.   Neurological: Negative.   Hematological: Negative.   Psychiatric/Behavioral: Negative.     Per HPI unless specifically indicated above     Objective:    BP 129/79   Pulse 88   Temp 97.8 F (36.6 C) (Oral)   Ht 5' 6.34" (1.685 m)   Wt 126 lb (57.2 kg)   BMI 20.13 kg/m   Wt Readings from Last 3 Encounters:  06/16/18 126 lb (57.2 kg)  12/14/17 124 lb (56.2 kg)  08/24/17 124 lb 12.8 oz (56.6 kg)    Physical Exam  Constitutional: She is oriented to person, place, and time. She appears well-developed and well-nourished.  HENT:  Head: Normocephalic and atraumatic.  Eyes: Pupils are equal, round, and reactive to light. Right eye exhibits no discharge. Left eye exhibits no discharge. No scleral icterus.  Neck: Normal range of motion. Neck supple. Carotid bruit is not present. No thyromegaly present.  Cardiovascular: Normal rate, regular rhythm and normal heart sounds. Exam reveals no gallop and no friction rub.  No murmur heard. Pulmonary/Chest: Effort normal and breath sounds normal. No respiratory distress. She has no wheezes. She has no rales. No breast tenderness or discharge.  Abdominal: Soft. Bowel sounds are normal. There is no tenderness. There is no rebound.  Genitourinary: No breast tenderness or discharge. Pelvic exam was performed with patient prone. There is no rash, tenderness, lesion or injury on the right labia. There is no rash, tenderness, lesion or injury on the left labia. Cervix exhibits no motion tenderness, no discharge and no friability. Right adnexum displays no mass, no tenderness and no fullness. Left adnexum displays no mass, no tenderness and no fullness.  Genitourinary Comments: Nodule noted around anus at 11 o clock  Musculoskeletal: Normal range of motion.  Lymphadenopathy:    She has no cervical adenopathy.  Neurological: She is alert and  oriented to person, place, and time.  Skin: Skin is warm, dry and intact. No rash noted.  Psychiatric: She has a normal mood and  affect. Her speech is normal and behavior is normal. Judgment and thought content normal. Cognition and memory are normal.    Results for orders placed or performed in visit on 05/17/17  CBC with Differential/Platelet  Result Value Ref Range   WBC 8.7 3.4 - 10.8 x10E3/uL   RBC 4.70 3.77 - 5.28 x10E6/uL   Hemoglobin 13.3 11.1 - 15.9 g/dL   Hematocrit 42.4 34.0 - 46.6 %   MCV 90 79 - 97 fL   MCH 28.3 26.6 - 33.0 pg   MCHC 31.4 (L) 31.5 - 35.7 g/dL   RDW 13.9 12.3 - 15.4 %   Platelets 356 150 - 379 x10E3/uL   Neutrophils 44 Not Estab. %   Lymphs 42 Not Estab. %   Monocytes 11 Not Estab. %   Eos 2 Not Estab. %   Basos 1 Not Estab. %   Neutrophils Absolute 3.8 1.4 - 7.0 x10E3/uL   Lymphocytes Absolute 3.6 (H) 0.7 - 3.1 x10E3/uL   Monocytes Absolute 1.0 (H) 0.1 - 0.9 x10E3/uL   EOS (ABSOLUTE) 0.2 0.0 - 0.4 x10E3/uL   Basophils Absolute 0.0 0.0 - 0.2 x10E3/uL   Immature Granulocytes 0 Not Estab. %   Immature Grans (Abs) 0.0 0.0 - 0.1 x10E3/uL  Comprehensive metabolic panel  Result Value Ref Range   Glucose 66 65 - 99 mg/dL   BUN 13 6 - 24 mg/dL   Creatinine, Ser 0.70 0.57 - 1.00 mg/dL   GFR calc non Af Amer 103 >59 mL/min/1.73   GFR calc Af Amer 118 >59 mL/min/1.73   BUN/Creatinine Ratio 19 9 - 23   Sodium 141 134 - 144 mmol/L   Potassium 3.7 3.5 - 5.2 mmol/L   Chloride 103 96 - 106 mmol/L   CO2 19 (L) 20 - 29 mmol/L   Calcium 9.3 8.7 - 10.2 mg/dL   Total Protein 6.9 6.0 - 8.5 g/dL   Albumin 4.3 3.5 - 5.5 g/dL   Globulin, Total 2.6 1.5 - 4.5 g/dL   Albumin/Globulin Ratio 1.7 1.2 - 2.2   Bilirubin Total 0.3 0.0 - 1.2 mg/dL   Alkaline Phosphatase 86 39 - 117 IU/L   AST 16 0 - 40 IU/L   ALT 10 0 - 32 IU/L  Lipid Panel w/o Chol/HDL Ratio  Result Value Ref Range   Cholesterol, Total 163 100 - 199 mg/dL   Triglycerides 139 0 - 149 mg/dL   HDL 43 >39  mg/dL   VLDL Cholesterol Cal 28 5 - 40 mg/dL   LDL Calculated 92 0 - 99 mg/dL  TSH  Result Value Ref Range   TSH 1.550 0.450 - 4.500 uIU/mL  Vitamin B12  Result Value Ref Range   Vitamin B-12 282 232 - 1,245 pg/mL      Assessment & Plan:   Problem List Items Addressed This Visit      Unprioritized   Benign hypertension    Stable, ? If HCTZ is causing lymph node swelling. Will change to Amlodipine 5 mg       Relevant Medications   hydrochlorothiazide (HYDRODIURIL) 25 MG tablet   Other Relevant Orders   Lipid Panel w/o Chol/HDL Ratio   Comprehensive metabolic panel   History of abnormal cervical Pap smear    With Leep procedure.  Will put patient on yearly screening      Relevant Orders   Cytology - PAP    Other Visit Diagnoses    Annual physical exam    -  Primary   Relevant  Orders   MM DIGITAL SCREENING BILATERAL   Cytology - PAP   TSH   CBC with Differential/Platelet   Nodule of anus       Refer to surgery as nodule is getting bigger and bleeding   Relevant Orders   Ambulatory referral to General Surgery       Follow up plan: Return in about 4 weeks (around 07/14/2018). for BP

## 2018-06-16 NOTE — Assessment & Plan Note (Signed)
With Leep procedure.  Will put patient on yearly screening

## 2018-06-16 NOTE — Assessment & Plan Note (Addendum)
Stable, ? If HCTZ is causing lymph node swelling. Will change to Amlodipine 5 mg

## 2018-06-17 LAB — COMPREHENSIVE METABOLIC PANEL
ALBUMIN: 4.6 g/dL (ref 3.5–5.5)
ALK PHOS: 83 IU/L (ref 39–117)
ALT: 17 IU/L (ref 0–32)
AST: 26 IU/L (ref 0–40)
Albumin/Globulin Ratio: 1.6 (ref 1.2–2.2)
BILIRUBIN TOTAL: 0.4 mg/dL (ref 0.0–1.2)
BUN / CREAT RATIO: 29 — AB (ref 9–23)
BUN: 16 mg/dL (ref 6–24)
CHLORIDE: 101 mmol/L (ref 96–106)
CO2: 19 mmol/L — ABNORMAL LOW (ref 20–29)
Calcium: 9.3 mg/dL (ref 8.7–10.2)
Creatinine, Ser: 0.56 mg/dL — ABNORMAL LOW (ref 0.57–1.00)
GFR calc Af Amer: 127 mL/min/{1.73_m2} (ref >59)
GFR calc non Af Amer: 110 mL/min/{1.73_m2} (ref >59)
GLOBULIN, TOTAL: 2.8 g/dL (ref 1.5–4.5)
Glucose: 76 mg/dL (ref 65–99)
POTASSIUM: 3.1 mmol/L — AB (ref 3.5–5.2)
SODIUM: 140 mmol/L (ref 134–144)
Total Protein: 7.4 g/dL (ref 6.0–8.5)

## 2018-06-17 LAB — CBC WITH DIFFERENTIAL/PLATELET
BASOS: 1 %
Basophils Absolute: 0.1 10*3/uL (ref 0.0–0.2)
EOS (ABSOLUTE): 0.1 10*3/uL (ref 0.0–0.4)
Eos: 1 %
HEMATOCRIT: 43.1 % (ref 34.0–46.6)
HEMOGLOBIN: 13.9 g/dL (ref 11.1–15.9)
Immature Grans (Abs): 0 10*3/uL (ref 0.0–0.1)
Immature Granulocytes: 0 %
LYMPHS ABS: 1.9 10*3/uL (ref 0.7–3.1)
Lymphs: 26 %
MCH: 28.1 pg (ref 26.6–33.0)
MCHC: 32.3 g/dL (ref 31.5–35.7)
MCV: 87 fL (ref 79–97)
MONOCYTES: 13 %
Monocytes Absolute: 0.9 10*3/uL (ref 0.1–0.9)
NEUTROS ABS: 4.2 10*3/uL (ref 1.4–7.0)
Neutrophils: 59 %
Platelets: 413 10*3/uL (ref 150–450)
RBC: 4.95 x10E6/uL (ref 3.77–5.28)
RDW: 13.1 % (ref 12.3–15.4)
WBC: 7.2 10*3/uL (ref 3.4–10.8)

## 2018-06-17 LAB — TSH: TSH: 0.752 u[IU]/mL (ref 0.450–4.500)

## 2018-06-17 LAB — LIPID PANEL W/O CHOL/HDL RATIO
Cholesterol, Total: 172 mg/dL (ref 100–199)
HDL: 32 mg/dL — AB (ref >39)
LDL Calculated: 107 mg/dL — ABNORMAL HIGH (ref 0–99)
TRIGLYCERIDES: 165 mg/dL — AB (ref 0–149)
VLDL Cholesterol Cal: 33 mg/dL (ref 5–40)

## 2018-06-19 ENCOUNTER — Encounter: Payer: Self-pay | Admitting: Unknown Physician Specialty

## 2018-06-19 ENCOUNTER — Other Ambulatory Visit: Payer: Self-pay | Admitting: Unknown Physician Specialty

## 2018-06-19 DIAGNOSIS — E876 Hypokalemia: Secondary | ICD-10-CM

## 2018-06-20 ENCOUNTER — Telehealth: Payer: Self-pay | Admitting: Family Medicine

## 2018-06-20 DIAGNOSIS — R8789 Other abnormal findings in specimens from female genital organs: Secondary | ICD-10-CM

## 2018-06-20 DIAGNOSIS — R87618 Other abnormal cytological findings on specimens from cervix uteri: Secondary | ICD-10-CM

## 2018-06-20 LAB — CYTOLOGY - PAP
DIAGNOSIS: NEGATIVE
HPV (WINDOPATH): DETECTED — AB
HPV 16/18/45 genotyping: NEGATIVE

## 2018-06-20 NOTE — Telephone Encounter (Signed)
Realized I called the wrong lab. Called over to the [facility]. They are faxing over a add on form for [place] to sign for this. Will sign and fax back when received.

## 2018-06-20 NOTE — Telephone Encounter (Signed)
Called over to the lab to ask about this for Dr. Wynetta Emery. Left my name and number with the receptionist to have someone call me back regarding this.

## 2018-06-20 NOTE — Telephone Encounter (Signed)
It looks like HPV was not ordered on this patient, however based on her age it should have been- can we see if the lab can add an HPV on so she doesn't have to get her pap done again faster? I'll be happy to add an order for it.

## 2018-06-22 DIAGNOSIS — R87618 Other abnormal cytological findings on specimens from cervix uteri: Secondary | ICD-10-CM | POA: Insufficient documentation

## 2018-06-22 DIAGNOSIS — R8789 Other abnormal findings in specimens from female genital organs: Secondary | ICD-10-CM | POA: Insufficient documentation

## 2018-06-22 NOTE — Telephone Encounter (Signed)
Please let patient know that her pap came back normal, but she was +for HPV, so we'll recheck it next year. Thanks!

## 2018-06-22 NOTE — Telephone Encounter (Signed)
Patient notified

## 2018-07-06 ENCOUNTER — Ambulatory Visit (INDEPENDENT_AMBULATORY_CARE_PROVIDER_SITE_OTHER): Payer: 59 | Admitting: General Surgery

## 2018-07-06 ENCOUNTER — Encounter: Payer: Self-pay | Admitting: General Surgery

## 2018-07-06 ENCOUNTER — Other Ambulatory Visit: Payer: Self-pay

## 2018-07-06 ENCOUNTER — Telehealth: Payer: Self-pay | Admitting: Nurse Practitioner

## 2018-07-06 VITALS — BP 128/79 | HR 80 | Temp 98.1°F | Resp 20 | Ht 66.34 in | Wt 129.2 lb

## 2018-07-06 DIAGNOSIS — K649 Unspecified hemorrhoids: Secondary | ICD-10-CM | POA: Diagnosis not present

## 2018-07-06 DIAGNOSIS — N6320 Unspecified lump in the left breast, unspecified quadrant: Secondary | ICD-10-CM

## 2018-07-06 MED ORDER — HYDROCORTISONE 2.5 % RE CREA: 1.0000 "application " | TOPICAL_CREAM | Freq: Two times a day (BID) | RECTAL | 0 refills | Status: DC

## 2018-07-06 NOTE — Telephone Encounter (Signed)
Placed orders for future.  Patient will need to call and schedule appointments. Thank you.

## 2018-07-06 NOTE — Patient Instructions (Addendum)
Patient has been informed to use tucks pads and cortizone cream, return to the office as needed. Pick up medication from pharmacy on file. Patient has been informed to schedule a colonoscopy. Return in 1 month.   Call the office with any questions or concerns.

## 2018-07-06 NOTE — Telephone Encounter (Signed)
Copied from [place] [id number]. Topic: General - Inquiry >> Jul 06, 2018  8:25 AM Judyann Munson wrote: Reason for CRM: Patient is calling to state she is needing extra orders instead of just a mammogram. The patient stated she found a lump on the same left breast that was there two years. The patient best contact number is [phone]. The patient stated she is at work and can be left a detail vm.

## 2018-07-06 NOTE — Addendum Note (Signed)
Addended by: Marnee Guarneri T on: 07/06/2018 01:37 PM   Modules accepted: Orders

## 2018-07-06 NOTE — Progress Notes (Signed)
Patient ID: Wanda Hudson, female   DOB: July 22, 1971, 47 y.o.   MRN: [id number]  Chief Complaint  Patient presents with  . Follow-up    hemorrhoids    HPI Wanda Hudson is a 47 y.o. female here today for hemorrhoids states it has been there for 1 year has some bleeding when wiping after using the restroom. Patient states she is otherwise doing well.  Bowels move daily.  Rare hard stool.  No history diarrhea.  HPI   Past Medical History:  Diagnosis Date  . Anxiety   . Depression   . Insomnia   . Osteopenia   . Peripheral neuropathy     Past Surgical History:  Procedure Laterality Date  . 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 DG SALIVARY GLANDS ([place] HX)    . TUMOR REMOVAL     ears    Family History  Problem Relation Age of Onset  . Diabetes Mother   . Hypertension Mother   . Stroke Mother   . Diabetes Father   . Hypertension Father   . Heart disease Maternal Grandfather        MI  . Heart disease Paternal Grandfather        MI  . Hypertension Brother   . Aneurysm Maternal Grandmother     Social History Social History   Tobacco Use  . Smoking status: Current Some Day Smoker    Packs/day: 0.50    Years: 21.00    Pack years: 10.50    Types: Cigarettes  . Smokeless tobacco: Never Used  Substance Use Topics  . Alcohol use: No    Comment: occasionally  . Drug use: No    Allergies  Allergen Reactions  . Codeine Rash    Current Outpatient Medications  Medication Sig Dispense Refill  . Alpha-Lipoic Acid 100 MG TABS Take 1 tablet by mouth daily.    Marland Kitchen amLODipine (NORVASC) 5 MG tablet Take 1 tablet (5 mg total) by mouth daily. 90 tablet 3  . Aspirin-Salicylamide-Caffeine (BC FAST PAIN RELIEF) 650-195-33.3 MG PACK Take by mouth as needed.    . Evening Primrose Oil 500 MG CAPS Take 500 mg by mouth daily.    Marland Kitchen neomycin-polymyxin-hydrocortisone (CORTISPORIN) OTIC solution Place 4 drops into the left ear 4 (four) times daily. 10 mL 0  . Vitamin D,  Cholecalciferol, 1000 UNITS TABS Take 1,000 Units by mouth daily.    . hydrocortisone (ANUSOL-HC) 2.5 % rectal cream Place 1 application rectally 2 (two) times daily. 30 g 0   No current facility-administered medications for this visit.     Review of Systems Review of Systems  Constitutional: Negative.   Respiratory: Negative.   Cardiovascular: Negative.     Blood pressure 128/79, pulse 80, temperature 98.1 F (36.7 C), temperature source Temporal, resp. rate 20, height 5' 6.34" (1.685 m), weight 129 lb 3.2 oz (58.6 kg), SpO2 98 %.  Physical Exam Physical Exam  Constitutional: She is oriented to person, place, and time. She appears well-developed and well-nourished.  Eyes: Conjunctivae are normal. No scleral icterus.  Cardiovascular: Normal rate, regular rhythm and normal heart sounds.  Pulmonary/Chest: Effort normal and breath sounds normal.  Genitourinary:     Lymphadenopathy:    She has no cervical adenopathy.  Neurological: She is alert and oriented to person, place, and time.  Skin: Skin is warm and dry.    Data Reviewed Anoscopy showed no secondary pathology.  Assessment    Irritated anal skin secondary to  vigorous perianal cleansing.    Plan The patient has been asked to discontinue the use of soap and a washcloth for perianal cleansing instead to use her handheld shower. Careful drying of the perineal skin before dressing has been encouraged.  Sparing application of Anusol HC cream twice daily until perianal irritation has resolved has been encouraged.  Patient has been informed to use tucks pads and cortizone cream, return to the office as needed. Pick up medication from pharmacy on file.   Patient has been informed to schedule a colonoscopy. Return in 1 month. HPI, Physical Exam, Assessment and Plan have been scribed under the direction and in the presence of Hervey Ard, Md.  Eudelia Bunch R. Bobette Mo, CMA  I have completed the exam and reviewed the above  documentation for accuracy and completeness.  I agree with the above.  Haematologist has been used and any errors in dictation or transcription are unintentional.  Hervey Ard, M.D., F.A.C.S.  [name] Jovanni Rash 07/06/2018, 9:01 PM

## 2018-07-06 NOTE — Telephone Encounter (Signed)
Called and spoke with Wanda Hudson. Due to pt's history they stated they would need the following orders: [id number], [id number], [id number]. They also requested that you placed the location of new lump on referral. Spoke with patient that is on her left breast, left midline area.

## 2018-07-14 ENCOUNTER — Telehealth: Payer: Self-pay | Admitting: Nurse Practitioner

## 2018-07-14 ENCOUNTER — Ambulatory Visit: Payer: 59

## 2018-07-14 NOTE — Telephone Encounter (Signed)
Called pt to let her know that office is closing at 12 since she's scheduled for 3:30 no answer unable leave message due to mail box being full

## 2018-07-14 NOTE — Telephone Encounter (Signed)
Thank you :)

## 2018-07-17 ENCOUNTER — Encounter: Payer: Self-pay | Admitting: Nurse Practitioner

## 2018-07-17 ENCOUNTER — Ambulatory Visit (INDEPENDENT_AMBULATORY_CARE_PROVIDER_SITE_OTHER): Payer: 59 | Admitting: Nurse Practitioner

## 2018-07-17 ENCOUNTER — Other Ambulatory Visit: Payer: Self-pay

## 2018-07-17 VITALS — BP 128/74 | HR 88 | Temp 98.4°F | Ht 66.3 in | Wt 127.0 lb

## 2018-07-17 DIAGNOSIS — Z3042 Encounter for surveillance of injectable contraceptive: Secondary | ICD-10-CM | POA: Diagnosis not present

## 2018-07-17 DIAGNOSIS — I10 Essential (primary) hypertension: Secondary | ICD-10-CM

## 2018-07-17 MED ORDER — MEDROXYPROGESTERONE ACETATE 150 MG/ML IM SUSP
150.0000 mg | Freq: Once | INTRAMUSCULAR | Status: AC
  Administered 2018-07-17: 150 mg via INTRAMUSCULAR

## 2018-07-17 NOTE — Assessment & Plan Note (Signed)
Chronic, stable.  Initial BP slightly elevated, with repeat normal and home BP below goal.  Continue current regimen.

## 2018-07-17 NOTE — Patient Instructions (Signed)
DASH Eating Plan DASH stands for "Dietary Approaches to Stop Hypertension." The DASH eating plan is a healthy eating plan that has been shown to reduce high blood pressure (hypertension). It may also reduce your risk for type 2 diabetes, heart disease, and stroke. The DASH eating plan may also help with weight loss. What are tips for following this plan? General guidelines  Avoid eating more than 2,300 mg (milligrams) of salt (sodium) a day. If you have hypertension, you may need to reduce your sodium intake to 1,500 mg a day.  Limit alcohol intake to no more than 1 drink a day for nonpregnant women and 2 drinks a day for men. One drink equals 12 oz of beer, 5 oz of wine, or 1 oz of hard liquor.  Work with your health care provider to maintain a healthy body weight or to lose weight. Ask what an ideal weight is for you.  Get at least 30 minutes of exercise that causes your heart to beat faster (aerobic exercise) most days of the week. Activities may include walking, swimming, or biking.  Work with your health care provider or diet and nutrition specialist (dietitian) to adjust your eating plan to your individual calorie needs. Reading food labels  Check food labels for the amount of sodium per serving. Choose foods with less than 5 percent of the Daily Value of sodium. Generally, foods with less than 300 mg of sodium per serving fit into this eating plan.  To find whole grains, look for the word "whole" as the first word in the ingredient list. Shopping  Buy products labeled as "low-sodium" or "no salt added."  Buy fresh foods. Avoid canned foods and premade or frozen meals. Cooking  Avoid adding salt when cooking. Use salt-free seasonings or herbs instead of table salt or sea salt. Check with your health care provider or pharmacist before using salt substitutes.  Do not fry foods. Cook foods using healthy methods such as baking, boiling, grilling, and broiling instead.  Cook with  heart-healthy oils, such as olive, canola, soybean, or sunflower oil. Meal planning   Eat a balanced diet that includes: ? 5 or more servings of fruits and vegetables each day. At each meal, try to fill half of your plate with fruits and vegetables. ? Up to 6-8 servings of whole grains each day. ? Less than 6 oz of lean meat, poultry, or fish each day. A 3-oz serving of meat is about the same size as a deck of cards. One egg equals 1 oz. ? 2 servings of low-fat dairy each day. ? A serving of nuts, seeds, or beans 5 times each week. ? Heart-healthy fats. Healthy fats called Omega-3 fatty acids are found in foods such as flaxseeds and coldwater fish, like sardines, salmon, and mackerel.  Limit how much you eat of the following: ? Canned or prepackaged foods. ? Food that is high in trans fat, such as fried foods. ? Food that is high in saturated fat, such as fatty meat. ? Sweets, desserts, sugary drinks, and other foods with added sugar. ? Full-fat dairy products.  Do not salt foods before eating.  Try to eat at least 2 vegetarian meals each week.  Eat more home-cooked food and less restaurant, buffet, and fast food.  When eating at a restaurant, ask that your food be prepared with less salt or no salt, if possible. What foods are recommended? The items listed may not be a complete list. Talk with your dietitian about what   dietary choices are best for you. Grains Whole-grain or whole-wheat bread. Whole-grain or whole-wheat pasta. Brown rice. Oatmeal. Quinoa. Bulgur. Whole-grain and low-sodium cereals. Pita bread. Low-fat, low-sodium crackers. Whole-wheat flour tortillas. Vegetables Fresh or frozen vegetables (raw, steamed, roasted, or grilled). Low-sodium or reduced-sodium tomato and vegetable juice. Low-sodium or reduced-sodium tomato sauce and tomato paste. Low-sodium or reduced-sodium canned vegetables. Fruits All fresh, dried, or frozen fruit. Canned fruit in natural juice (without  added sugar). Meat and other protein foods Skinless chicken or turkey. Ground chicken or turkey. Pork with fat trimmed off. Fish and seafood. Egg whites. Dried beans, peas, or lentils. Unsalted nuts, nut butters, and seeds. Unsalted canned beans. Lean cuts of beef with fat trimmed off. Low-sodium, lean deli meat. Dairy Low-fat (1%) or fat-free (skim) milk. Fat-free, low-fat, or reduced-fat cheeses. Nonfat, low-sodium ricotta or cottage cheese. Low-fat or nonfat yogurt. Low-fat, low-sodium cheese. Fats and oils Soft margarine without trans fats. Vegetable oil. Low-fat, reduced-fat, or light mayonnaise and salad dressings (reduced-sodium). Canola, safflower, olive, soybean, and sunflower oils. Avocado. Seasoning and other foods Herbs. Spices. Seasoning mixes without salt. Unsalted popcorn and pretzels. Fat-free sweets. What foods are not recommended? The items listed may not be a complete list. Talk with your dietitian about what dietary choices are best for you. Grains Baked goods made with fat, such as croissants, muffins, or some breads. Dry pasta or rice meal packs. Vegetables Creamed or fried vegetables. Vegetables in a cheese sauce. Regular canned vegetables (not low-sodium or reduced-sodium). Regular canned tomato sauce and paste (not low-sodium or reduced-sodium). Regular tomato and vegetable juice (not low-sodium or reduced-sodium). Pickles. Olives. Fruits Canned fruit in a light or heavy syrup. Fried fruit. Fruit in cream or butter sauce. Meat and other protein foods Fatty cuts of meat. Ribs. Fried meat. Bacon. Sausage. Bologna and other processed lunch meats. Salami. Fatback. Hotdogs. Bratwurst. Salted nuts and seeds. Canned beans with added salt. Canned or smoked fish. Whole eggs or egg yolks. Chicken or turkey with skin. Dairy Whole or 2% milk, cream, and half-and-half. Whole or full-fat cream cheese. Whole-fat or sweetened yogurt. Full-fat cheese. Nondairy creamers. Whipped toppings.  Processed cheese and cheese spreads. Fats and oils Butter. Stick margarine. Lard. Shortening. Ghee. Bacon fat. Tropical oils, such as coconut, palm kernel, or palm oil. Seasoning and other foods Salted popcorn and pretzels. Onion salt, garlic salt, seasoned salt, table salt, and sea salt. Worcestershire sauce. Tartar sauce. Barbecue sauce. Teriyaki sauce. Soy sauce, including reduced-sodium. Steak sauce. Canned and packaged gravies. Fish sauce. Oyster sauce. Cocktail sauce. Horseradish that you find on the shelf. Ketchup. Mustard. Meat flavorings and tenderizers. Bouillon cubes. Hot sauce and Tabasco sauce. Premade or packaged marinades. Premade or packaged taco seasonings. Relishes. Regular salad dressings. Where to find more information:  National Heart, Lung, and Blood Institute: www.nhlbi.nih.gov  American Heart Association: www.heart.org Summary  The DASH eating plan is a healthy eating plan that has been shown to reduce high blood pressure (hypertension). It may also reduce your risk for type 2 diabetes, heart disease, and stroke.  With the DASH eating plan, you should limit salt (sodium) intake to 2,300 mg a day. If you have hypertension, you may need to reduce your sodium intake to 1,500 mg a day.  When on the DASH eating plan, aim to eat more fresh fruits and vegetables, whole grains, lean proteins, low-fat dairy, and heart-healthy fats.  Work with your health care provider or diet and nutrition specialist (dietitian) to adjust your eating plan to your individual   calorie needs. This information is not intended to replace advice given to you by your health care provider. Make sure you discuss any questions you have with your health care provider. Document Released: 07/22/2011 Document Revised: 07/26/2016 Document Reviewed: 07/26/2016 Elsevier Interactive Patient Education  2018 [employer]  

## 2018-07-17 NOTE — Progress Notes (Signed)
BP 128/74 (BP Location: Left Arm, Patient Position: Sitting)   Pulse 88   Temp 98.4 F (36.9 C) (Oral)   Ht 5' 6.3" (1.684 m)   Wt 127 lb (57.6 kg)   SpO2 99%   BMI 20.31 kg/m    Subjective:    Patient ID: Wanda Hudson, female    DOB: [birth year], 47 y.o.   MRN: [id number]  HPI: ADDI PAK is a 47 y.o. female presents for hypertension check and Depo shot.  Chief Complaint  Patient presents with  . Hypertension    f/u/ and depo shot   HYPERTENSION  Was taken off of HCTZ at beginning of November d/t swollen gland, ENT had felt "too much fluid pulled off" and recommended d/c of HCTZ.  She reports that gland is less swollen now and no further issues. Satisfied with current treatment? yes Duration of hypertension: chronic BP monitoring frequency: weekly BP range: 115-120/60's BP medication side effects: no Past BP meds: HCTZ Aspirin: no Recurrent headaches: no Visual changes: no Palpitations: no Dyspnea: no Chest pain: no Lower extremity edema: no Dizzy/lightheaded: no  BIRTH CONTROL ASSESSMENT: Continue on Depo injections.  No ADR present.  Returns today for Depo. Last injection was 04/28/18.  Relevant past medical, surgical, family and social history reviewed and updated as indicated. Interim medical history since our last visit reviewed. Allergies and medications reviewed and updated.  Review of Systems  Constitutional: Negative for activity change, appetite change, fatigue, fever and unexpected weight change.  Respiratory: Negative for cough, chest tightness, shortness of breath and wheezing.   Cardiovascular: Negative for chest pain, palpitations and leg swelling.  Gastrointestinal: Negative for abdominal distention, abdominal pain, constipation, diarrhea, nausea and vomiting.  Endocrine: Negative.   Neurological: Negative for dizziness, syncope, weakness, light-headedness, numbness and headaches.  Psychiatric/Behavioral: Negative.     Per HPI unless  specifically indicated above     Objective:    BP 128/74 (BP Location: Left Arm, Patient Position: Sitting)   Pulse 88   Temp 98.4 F (36.9 C) (Oral)   Ht 5' 6.3" (1.684 m)   Wt 127 lb (57.6 kg)   SpO2 99%   BMI 20.31 kg/m   Wt Readings from Last 3 Encounters:  07/17/18 127 lb (57.6 kg)  07/06/18 129 lb 3.2 oz (58.6 kg)  06/16/18 126 lb (57.2 kg)    Physical Exam  Constitutional: She is oriented to person, place, and time. She appears well-developed and well-nourished.  HENT:  Head: Normocephalic.  Eyes: Pupils are equal, round, and reactive to light. Conjunctivae and EOM are normal. Right eye exhibits no discharge. Left eye exhibits no discharge.  Neck: Normal range of motion. Neck supple. No JVD present. Carotid bruit is not present. No thyromegaly present.  Cardiovascular: Normal rate, regular rhythm and normal heart sounds.  Pulmonary/Chest: Effort normal and breath sounds normal.  Abdominal: Soft. Bowel sounds are normal.  Lymphadenopathy:    She has no cervical adenopathy.  Neurological: She is alert and oriented to person, place, and time.  Skin: Skin is warm and dry.  Psychiatric: She has a normal mood and affect. Her behavior is normal. Judgment and thought content normal.  Nursing note and vitals reviewed.   Results for orders placed or performed in visit on 06/16/18  TSH  Result Value Ref Range   TSH 0.752 0.450 - 4.500 uIU/mL  Lipid Panel w/o Chol/HDL Ratio  Result Value Ref Range   Cholesterol, Total 172 100 - 199 mg/dL   Triglycerides  165 (H) 0 - 149 mg/dL   HDL 32 (L) >39 mg/dL   VLDL Cholesterol Cal 33 5 - 40 mg/dL   LDL Calculated 107 (H) 0 - 99 mg/dL  Comprehensive metabolic panel  Result Value Ref Range   Glucose 76 65 - 99 mg/dL   BUN 16 6 - 24 mg/dL   Creatinine, Ser 0.56 (L) 0.57 - 1.00 mg/dL   GFR calc non Af Amer 110 >59 mL/min/1.73   GFR calc Af Amer 127 >59 mL/min/1.73   BUN/Creatinine Ratio 29 (H) 9 - 23   Sodium 140 134 - 144 mmol/L     Potassium 3.1 (L) 3.5 - 5.2 mmol/L   Chloride 101 96 - 106 mmol/L   CO2 19 (L) 20 - 29 mmol/L   Calcium 9.3 8.7 - 10.2 mg/dL   Total Protein 7.4 6.0 - 8.5 g/dL   Albumin 4.6 3.5 - 5.5 g/dL   Globulin, Total 2.8 1.5 - 4.5 g/dL   Albumin/Globulin Ratio 1.6 1.2 - 2.2   Bilirubin Total 0.4 0.0 - 1.2 mg/dL   Alkaline Phosphatase 83 39 - 117 IU/L   AST 26 0 - 40 IU/L   ALT 17 0 - 32 IU/L  CBC with Differential/Platelet  Result Value Ref Range   WBC 7.2 3.4 - 10.8 x10E3/uL   RBC 4.95 3.77 - 5.28 x10E6/uL   Hemoglobin 13.9 11.1 - 15.9 g/dL   Hematocrit 43.1 34.0 - 46.6 %   MCV 87 79 - 97 fL   MCH 28.1 26.6 - 33.0 pg   MCHC 32.3 31.5 - 35.7 g/dL   RDW 13.1 12.3 - 15.4 %   Platelets 413 150 - 450 x10E3/uL   Neutrophils 59 Not Estab. %   Lymphs 26 Not Estab. %   Monocytes 13 Not Estab. %   Eos 1 Not Estab. %   Basos 1 Not Estab. %   Neutrophils Absolute 4.2 1.4 - 7.0 x10E3/uL   Lymphocytes Absolute 1.9 0.7 - 3.1 x10E3/uL   Monocytes Absolute 0.9 0.1 - 0.9 x10E3/uL   EOS (ABSOLUTE) 0.1 0.0 - 0.4 x10E3/uL   Basophils Absolute 0.1 0.0 - 0.2 x10E3/uL   Immature Granulocytes 0 Not Estab. %   Immature Grans (Abs) 0.0 0.0 - 0.1 x10E3/uL  Cytology - PAP  Result Value Ref Range   Adequacy      Satisfactory for evaluation  endocervical/transformation zone component PRESENT.   Diagnosis      NEGATIVE FOR INTRAEPITHELIAL LESIONS OR MALIGNANCY.   HPV DETECTED (A)    HPV 16/18/45 genotyping NEGATIVE for HPV 16 & 18/45    Material Submitted CervicoVaginal Pap [ThinPrep Imaged]    CYTOLOGY - PAP PAP RESULT       Assessment & Plan:   Problem List Items Addressed This Visit      Cardiovascular and Mediastinum   Benign hypertension    Chronic, stable.  Initial BP slightly elevated, with repeat normal and home BP below goal.  Continue current regimen.       Other Visit Diagnoses    Encounter for surveillance of injectable contraceptive    -  Primary   Relevant Medications    medroxyPROGESTERone (DEPO-PROVERA) injection 150 mg       Follow up plan: Return in about 1 year (around 07/18/2019) for annual physical.

## 2018-07-18 ENCOUNTER — Ambulatory Visit
Admission: RE | Admit: 2018-07-18 | Discharge: 2018-07-18 | Disposition: A | Discharge status: Home / Self Care | Payer: 59 | Source: Ambulatory Visit | Attending: Nurse Practitioner | Admitting: Nurse Practitioner

## 2018-07-18 DIAGNOSIS — N6320 Unspecified lump in the left breast, unspecified quadrant: Secondary | ICD-10-CM

## 2018-07-20 ENCOUNTER — Ambulatory Visit: Payer: 59 | Admitting: General Surgery

## 2018-08-18 ENCOUNTER — Telehealth: Payer: Self-pay

## 2018-08-18 DIAGNOSIS — E876 Hypokalemia: Secondary | ICD-10-CM

## 2018-08-18 NOTE — Addendum Note (Signed)
Addended by: Kathrine Haddock on: 08/18/2018 02:33 PM   Modules accepted: Orders

## 2018-08-18 NOTE — Telephone Encounter (Signed)
Patient notified. Pt stated that she will come by to the office to get the BMP lab done when she has a chance.

## 2018-08-18 NOTE — Progress Notes (Signed)
Attempted to reach pt. VM box was full and unable to leave VM.

## 2018-08-18 NOTE — Telephone Encounter (Signed)
-----   Message from Kathrine Haddock, NP sent at 08/18/2018  7:58 AM EST ----- Regarding: Needs a BMP Please let her know she needs a BMP for a low potassium ----- Message ----- From: SYSTEM Sent: 07/24/2018  12:07 AM EST To: Kathrine Haddock, NP

## 2018-08-23 ENCOUNTER — Encounter: Payer: Self-pay | Admitting: *Deleted

## 2018-10-06 ENCOUNTER — Ambulatory Visit (INDEPENDENT_AMBULATORY_CARE_PROVIDER_SITE_OTHER): Payer: 59 | Admitting: Family Medicine

## 2018-10-06 ENCOUNTER — Encounter: Payer: Self-pay | Admitting: Family Medicine

## 2018-10-06 VITALS — BP 153/83 | HR 89 | Temp 98.6°F | Wt 125.8 lb

## 2018-10-06 DIAGNOSIS — N6002 Solitary cyst of left breast: Secondary | ICD-10-CM | POA: Diagnosis not present

## 2018-10-06 NOTE — Progress Notes (Signed)
BP (!) 153/83   Pulse 89   Temp 98.6 F (37 C) (Oral)   Wt 125 lb 12.8 oz (57.1 kg)   SpO2 97%   BMI 20.12 kg/m    Subjective:    Patient ID: Wanda Hudson, female    DOB: July 26, 1971, 48 y.o.   MRN: [id number]  HPI: Wanda Hudson is a 48 y.o. female  Chief Complaint  Patient presents with  . Breast Mass    left breast, paint started Sunday night    Here today with concern of a left breast mass that seemed to come up a few days ago. Notes she's had issues with cysts recurring in this breast the past few years. Was seen by Dr. Fleet Contras 05/2016 for a cyst in the same area which was aspirated at that time with temporary relief. Recent mammogram and ultrasound of left breast performed 07/18/18 showing cyst in region and probably benign breast calcifications. Area has not changed since she noticed it a few days ago, only hurts when pressed. No fevers, chills, sweats. Not using compresses or anything OTC.   Relevant past medical, surgical, family and social history reviewed and updated as indicated. Interim medical history since our last visit reviewed. Allergies and medications reviewed and updated.  Review of Systems  Per HPI unless specifically indicated above     Objective:    BP (!) 153/83   Pulse 89   Temp 98.6 F (37 C) (Oral)   Wt 125 lb 12.8 oz (57.1 kg)   SpO2 97%   BMI 20.12 kg/m   Wt Readings from Last 3 Encounters:  10/06/18 125 lb 12.8 oz (57.1 kg)  07/17/18 127 lb (57.6 kg)  07/06/18 129 lb 3.2 oz (58.6 kg)    Physical Exam Vitals signs and nursing note reviewed.  Constitutional:      Appearance: Normal appearance. She is not ill-appearing.  HENT:     Head: Atraumatic.  Eyes:     Extraocular Movements: Extraocular movements intact.     Conjunctiva/sclera: Conjunctivae normal.  Neck:     Musculoskeletal: Normal range of motion and neck supple.  Cardiovascular:     Rate and Rhythm: Normal rate and regular rhythm.     Heart sounds: Normal heart  sounds.  Pulmonary:     Effort: Pulmonary effort is normal.     Breath sounds: Normal breath sounds.  Chest:    Musculoskeletal: Normal range of motion.  Skin:    General: Skin is warm and dry.  Neurological:     Mental Status: She is alert and oriented to person, place, and time.  Psychiatric:        Mood and Affect: Mood normal.        Thought Content: Thought content normal.        Judgment: Judgment normal.     Results for orders placed or performed in visit on 06/16/18  TSH  Result Value Ref Range   TSH 0.752 0.450 - 4.500 uIU/mL  Lipid Panel w/o Chol/HDL Ratio  Result Value Ref Range   Cholesterol, Total 172 100 - 199 mg/dL   Triglycerides 165 (H) 0 - 149 mg/dL   HDL 32 (L) >39 mg/dL   VLDL Cholesterol Cal 33 5 - 40 mg/dL   LDL Calculated 107 (H) 0 - 99 mg/dL  Comprehensive metabolic panel  Result Value Ref Range   Glucose 76 65 - 99 mg/dL   BUN 16 6 - 24 mg/dL   Creatinine, Ser 0.56 (L)  0.57 - 1.00 mg/dL   GFR calc non Af Amer 110 >59 mL/min/1.73   GFR calc Af Amer 127 >59 mL/min/1.73   BUN/Creatinine Ratio 29 (H) 9 - 23   Sodium 140 134 - 144 mmol/L   Potassium 3.1 (L) 3.5 - 5.2 mmol/L   Chloride 101 96 - 106 mmol/L   CO2 19 (L) 20 - 29 mmol/L   Calcium 9.3 8.7 - 10.2 mg/dL   Total Protein 7.4 6.0 - 8.5 g/dL   Albumin 4.6 3.5 - 5.5 g/dL   Globulin, Total 2.8 1.5 - 4.5 g/dL   Albumin/Globulin Ratio 1.6 1.2 - 2.2   Bilirubin Total 0.4 0.0 - 1.2 mg/dL   Alkaline Phosphatase 83 39 - 117 IU/L   AST 26 0 - 40 IU/L   ALT 17 0 - 32 IU/L  CBC with Differential/Platelet  Result Value Ref Range   WBC 7.2 3.4 - 10.8 x10E3/uL   RBC 4.95 3.77 - 5.28 x10E6/uL   Hemoglobin 13.9 11.1 - 15.9 g/dL   Hematocrit 43.1 34.0 - 46.6 %   MCV 87 79 - 97 fL   MCH 28.1 26.6 - 33.0 pg   MCHC 32.3 31.5 - 35.7 g/dL   RDW 13.1 12.3 - 15.4 %   Platelets 413 150 - 450 x10E3/uL   Neutrophils 59 Not Estab. %   Lymphs 26 Not Estab. %   Monocytes 13 Not Estab. %   Eos 1 Not Estab. %    Basos 1 Not Estab. %   Neutrophils Absolute 4.2 1.4 - 7.0 x10E3/uL   Lymphocytes Absolute 1.9 0.7 - 3.1 x10E3/uL   Monocytes Absolute 0.9 0.1 - 0.9 x10E3/uL   EOS (ABSOLUTE) 0.1 0.0 - 0.4 x10E3/uL   Basophils Absolute 0.1 0.0 - 0.2 x10E3/uL   Immature Granulocytes 0 Not Estab. %   Immature Grans (Abs) 0.0 0.0 - 0.1 x10E3/uL  Cytology - PAP  Result Value Ref Range   Adequacy      Satisfactory for evaluation  endocervical/transformation zone component PRESENT.   Diagnosis      NEGATIVE FOR INTRAEPITHELIAL LESIONS OR MALIGNANCY.   HPV DETECTED (A)    HPV 16/18/45 genotyping NEGATIVE for HPV 16 & 18/45    Material Submitted CervicoVaginal Pap [ThinPrep Imaged]    CYTOLOGY - PAP PAP RESULT       Assessment & Plan:   Problem List Items Addressed This Visit      Other   Breast cyst, left - Primary    Appt scheduled with Dr. Fleet Contras for further management. Return precautions given if worsening before then          Follow up plan: Return if symptoms worsen or fail to improve.

## 2018-10-07 NOTE — Assessment & Plan Note (Signed)
Appt scheduled with Dr. Fleet Contras for further management. Return precautions given if worsening before then

## 2018-10-10 ENCOUNTER — Telehealth: Payer: Self-pay | Admitting: General Surgery

## 2018-10-10 NOTE — Telephone Encounter (Signed)
Patients questions were answered regarding upcoming appointment with Dr.Byrnett. She wanted to know if she could expect a breast biopsy same day as appointment and we discussed that Dr.Byrnett would need to examine her breast first and he would let her know that day.  If any procedure is done in office, we would  use lidocaine to numb the area. She can still eat, drive and carry on her normal routine. She was wondering if she needed to bring someone to drive and was told no not necessarily. Patient was appreciative of the information.

## 2018-10-10 NOTE — Telephone Encounter (Signed)
Patient is calling and is asking if she is going to be having an biopsy done that day she comes in for her appointment. Please call patient and advise.

## 2018-10-17 ENCOUNTER — Ambulatory Visit (INDEPENDENT_AMBULATORY_CARE_PROVIDER_SITE_OTHER): Payer: 59 | Admitting: General Surgery

## 2018-10-17 ENCOUNTER — Ambulatory Visit (INDEPENDENT_AMBULATORY_CARE_PROVIDER_SITE_OTHER): Payer: 59

## 2018-10-17 ENCOUNTER — Encounter: Payer: Self-pay | Admitting: General Surgery

## 2018-10-17 VITALS — BP 133/85 | HR 81 | Temp 97.9°F | Resp 16 | Ht 66.0 in | Wt 125.6 lb

## 2018-10-17 DIAGNOSIS — N6002 Solitary cyst of left breast: Secondary | ICD-10-CM

## 2018-10-17 DIAGNOSIS — N6321 Unspecified lump in the left breast, upper outer quadrant: Secondary | ICD-10-CM | POA: Diagnosis not present

## 2018-10-17 NOTE — Patient Instructions (Addendum)
Discussed smoking cessation and caffeine reduction. The patient is aware to call back for any questions or new concerns. Follow up in

## 2018-10-17 NOTE — Progress Notes (Signed)
Patient ID: Wanda Hudson, female   DOB: October 22, 1970, 48 y.o.   MRN: [id number]  Chief Complaint  Patient presents with  . Breast Problem    left breast pain    HPI Wanda Hudson is a 48 y.o. female.  Here for evaluation of left breast pain, seen in 2017 for left breast cyst. She had her mammogram in December and that is when she noticed a knot and the pain has increased since mammogram. She states it is in a different location as before. She admits to increasing her caffeine intake over the last few months needing the "boost". Denies any breast injury or trauma.  The patient was last seen for her breast in October 2017 when she was identified with a large, 2.5 x 5.5 x 5.76 simple cyst that yielded 35 cc of turbid fluid on drainage.  HPI  Past Medical History:  Diagnosis Date  . Anxiety   . Breast cyst, left 2017  . Depression   . Insomnia   . Osteopenia   . Peripheral neuropathy     Past Surgical History:  Procedure Laterality Date  . BREAST CYST ASPIRATION Left 2017   Wanda Hudson's office  . 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 DG SALIVARY GLANDS ([facility] HX)    . TUMOR REMOVAL     ears    Family History  Problem Relation Age of Onset  . Diabetes Mother   . Hypertension Mother   . Stroke Mother   . Diabetes Father   . Hypertension Father   . Lung cancer Father   . Heart disease Maternal Grandfather        MI  . Heart disease Paternal Grandfather        MI  . Hypertension Brother   . Aneurysm Maternal Grandmother   . Breast cancer Neg Hx     Social History Social History   Tobacco Use  . Smoking status: Current Some Day Smoker    Packs/day: 0.50    Years: 21.00    Pack years: 10.50    Types: Cigarettes  . Smokeless tobacco: Never Used  Substance Use Topics  . Alcohol use: No    Comment: occasionally  . Drug use: No    Allergies  Allergen Reactions  . Codeine Rash    Current Outpatient Medications  Medication Sig Dispense Refill  . Alpha-Lipoic  Acid 100 MG TABS Take 1 tablet by mouth daily.    . Aspirin-Salicylamide-Caffeine (BC FAST PAIN RELIEF) 650-195-33.3 MG PACK Take by mouth as needed.    . Evening Primrose Oil 500 MG CAPS Take 500 mg by mouth daily.    . hydrocortisone (ANUSOL-HC) 2.5 % rectal cream Place 1 application rectally 2 (two) times daily. 30 g 0  . Vitamin D, Cholecalciferol, 1000 UNITS TABS Take 1,000 Units by mouth daily.     No current facility-administered medications for this visit.     Review of Systems Review of Systems  Blood pressure 133/85, pulse 81, temperature 97.9 F (36.6 C), temperature source Skin, resp. rate 16, height 5\' 6"  (1.676 m), weight 125 lb 9.6 oz (57 kg), SpO2 97 %.  Physical Exam Physical Exam Exam conducted with a chaperone present.  Constitutional:      Appearance: She is well-developed.  Eyes:     General: No scleral icterus.    Conjunctiva/sclera: Conjunctivae normal.  Neck:     Musculoskeletal: Neck supple.  Cardiovascular:     Rate and Rhythm: Normal rate and  regular rhythm.     Heart sounds: Normal heart sounds.  Pulmonary:     Effort: Pulmonary effort is normal.     Breath sounds: Normal breath sounds.  Chest:     Breasts:        Right: No inverted nipple, mass, nipple discharge, skin change or tenderness.        Left: Mass present. No inverted nipple, nipple discharge, skin change or tenderness.     Comments: Left breast mass at 2 o'clock  2 CFN  Lymphadenopathy:     Cervical: No cervical adenopathy.  Skin:    General: Skin is warm and dry.  Neurological:     Mental Status: She is alert and oriented to person, place, and time.  Psychiatric:        Behavior: Behavior normal.     Data Reviewed July 18, 2018 diagnostic mammogram showed exceptionally dense breast. Ultrasound of the same date described a 0.9 x 1.4 x 1.7 cm simple cyst at the 3 o'clock position.  Due to the patient's ongoing symptomatology she was interested in aspiration.  Ultrasound  examination showed 2 lesions in the left breast at the 2 o'clock position, 2 cm from the nipple.  The more superficially located lesion measures 0.7 x 1.06 cm.  Marked thickening of the cyst wall with vascularity was noted suggesting an acute inflammatory process.  Deep to this just above the level of the pectoralis fascia was the lesion identified on the December exam showing a 0.96 x 1.57 x 1.65 cm simple cyst.  Strong posterior acoustic enhancement.  Smooth borders.  At the 9 o'clock position 2 cm from the nipple ill irregular hypoechoic area with measurements of 0.49 x 0.54 x 0.76 cm was noted.  This showed no discernible shadowing or enhancement.  I could not demonstrate continuity with a retroareolar duct.  BI-RADS-3  It was elected to proceed to aspiration.  1 cc of 1% plain Xylocaine was used after alcohol prep.  The more superficial lesion was aspirated with a significant reduction in volume.  Thick, mucoid-like material was obtained and slides x4 were prepared.  I suspect this is an inflammatory process.  The deep simple cyst was aspirated with complete resolution.   Assessment Increased breast inflammation secondary to caffeine stimulation.  Plan  Discussed smoking cessation and caffeine reduction.  The patient has been encouraged to make use of Aleve, 2 tablets twice a day for the next few days to help minimize discomfort.  Then as needed.  She will be contacted when breast cytology is available.  We will plan for reassessment in about 4-6 weeks to confirm that the areas have completely resolved.  The patient is aware to call back for any questions or new concerns.    HPI, assessment, plan and physical exam has been scribed under the direction and in the presence of Robert Bellow, MD. Wanda Fetch, RN  I have completed the exam and reviewed the above documentation for accuracy and completeness.  I agree with the above.  Haematologist has been used and any errors in dictation  or transcription are unintentional.  Hervey Ard, M.D., F.A.C.S.  Wanda Hudson 10/17/2018, 9:13 PM

## 2018-10-18 ENCOUNTER — Telehealth: Payer: Self-pay | Admitting: *Deleted

## 2018-10-18 NOTE — Telephone Encounter (Signed)
Notified patient as instructed, patient pleased. Discussed follow-up appointments, patient agrees  

## 2018-10-18 NOTE — Telephone Encounter (Signed)
-----   Message from Robert Bellow, MD sent at 10/18/2018  4:06 PM EST ----- Please notify the patient the cytology showed only inflammation should plan on a follow up exam in 3-4 weeks.  Anti inflammatory (advil/aleve) and local heat    ----- Message ----- From: Interface, Lab In [facility] Sent: 10/18/2018   3:52 PM EST To: Robert Bellow, MD

## 2018-11-09 ENCOUNTER — Other Ambulatory Visit: Payer: Self-pay

## 2018-11-09 ENCOUNTER — Ambulatory Visit (INDEPENDENT_AMBULATORY_CARE_PROVIDER_SITE_OTHER): Payer: 59

## 2018-11-09 DIAGNOSIS — Z3042 Encounter for surveillance of injectable contraceptive: Secondary | ICD-10-CM | POA: Diagnosis not present

## 2018-11-09 LAB — PREGNANCY, URINE: Preg Test, Ur: NEGATIVE

## 2018-11-09 MED ORDER — MEDROXYPROGESTERONE ACETATE 150 MG/ML IM SUSP
150.0000 mg | Freq: Once | INTRAMUSCULAR | Status: AC
  Administered 2018-11-09: 150 mg via INTRAMUSCULAR

## 2018-11-16 ENCOUNTER — Ambulatory Visit (INDEPENDENT_AMBULATORY_CARE_PROVIDER_SITE_OTHER): Payer: 59

## 2018-11-16 ENCOUNTER — Other Ambulatory Visit: Payer: Self-pay

## 2018-11-16 ENCOUNTER — Ambulatory Visit: Payer: 59 | Admitting: General Surgery

## 2018-11-16 ENCOUNTER — Encounter: Payer: Self-pay | Admitting: General Surgery

## 2018-11-16 ENCOUNTER — Ambulatory Visit (INDEPENDENT_AMBULATORY_CARE_PROVIDER_SITE_OTHER): Payer: 59 | Admitting: General Surgery

## 2018-11-16 VITALS — BP 125/79 | HR 90 | Temp 97.9°F | Resp 14 | Ht 66.0 in | Wt 124.0 lb

## 2018-11-16 DIAGNOSIS — N6002 Solitary cyst of left breast: Secondary | ICD-10-CM

## 2018-11-16 DIAGNOSIS — N6321 Unspecified lump in the left breast, upper outer quadrant: Secondary | ICD-10-CM | POA: Diagnosis not present

## 2018-11-16 NOTE — Patient Instructions (Signed)
Continue self breast exams. Call office for any new breast issues or concerns.  Follow up as needed.  Mammogram due in December with PCP

## 2018-11-16 NOTE — Progress Notes (Signed)
Patient ID: Wanda Hudson, female   DOB: 1970/10/07, 48 y.o.   MRN: [id number]  Chief Complaint  Patient presents with  . Follow-up    breast cyst    HPI Wanda Hudson is a 48 y.o. female here for follow up for a left breast cyst. She reports that the area is much better and feels like it is fully resolved.  Since her last visit the patient has decreased her caffeine consumption and to a lesser extent her use of cigarettes.  The patient is presently pain-free and is unaware of any residual thickening in the breast. HPI  Past Medical History:  Diagnosis Date  . Anxiety   . Breast cyst, left 2017  . Depression   . Insomnia   . Osteopenia   . Peripheral neuropathy     Past Surgical History:  Procedure Laterality Date  . BREAST CYST ASPIRATION Left 2017   's office  . 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 DG SALIVARY GLANDS ([place] HX)    . TUMOR REMOVAL     ears    Family History  Problem Relation Age of Onset  . Diabetes Mother   . Hypertension Mother   . Stroke Mother   . Diabetes Father   . Hypertension Father   . Lung cancer Father   . Heart disease Maternal Grandfather        MI  . Heart disease Paternal Grandfather        MI  . Hypertension Brother   . Aneurysm Maternal Grandmother   . Breast cancer Neg Hx     Social History Social History   Tobacco Use  . Smoking status: Current Some Day Smoker    Packs/day: 0.50    Years: 21.00    Pack years: 10.50    Types: Cigarettes  . Smokeless tobacco: Never Used  Substance Use Topics  . Alcohol use: No    Comment: occasionally  . Drug use: No    Allergies  Allergen Reactions  . Codeine Rash    Current Outpatient Medications  Medication Sig Dispense Refill  . Alpha-Lipoic Acid 100 MG TABS Take 1 tablet by mouth daily.    Marland Kitchen amLODipine (NORVASC) 5 MG tablet Take 5 mg by mouth daily.    . Aspirin-Salicylamide-Caffeine (BC FAST PAIN RELIEF) 650-195-33.3 MG PACK Take by mouth as needed.    .  Evening Primrose Oil 500 MG CAPS Take 500 mg by mouth daily.    . hydrocortisone (ANUSOL-HC) 2.5 % rectal cream Place 1 application rectally 2 (two) times daily. 30 g 0  . sulfacetamide (BLEPH-10) 10 % ophthalmic solution APPLY 4 DROPS TO DRAINING EAR TWICE DAILY FOR 2 WEEKS    . sulfamethoxazole-trimethoprim (BACTRIM DS,SEPTRA DS) 800-160 MG tablet TAKE ONE PILL TWICE DAILY FOR 14 DAYS.    Marland Kitchen Vitamin D, Cholecalciferol, 1000 UNITS TABS Take 1,000 Units by mouth daily.     No current facility-administered medications for this visit.     Review of Systems Review of Systems  Constitutional: Negative.   Respiratory: Negative.   Cardiovascular: Negative.     Blood pressure 125/79, pulse 90, temperature 97.9 F (36.6 C), resp. rate 14, height 5\' 6"  (1.676 m), weight 124 lb (56.2 kg).  Physical Exam Physical Exam Exam conducted with a chaperone present.  Constitutional:      Appearance: She is well-developed.  Eyes:     General: No scleral icterus. Chest:     Breasts:  Left: No inverted nipple, mass, nipple discharge, skin change or tenderness.    Lymphadenopathy:     Upper Body:     Left upper body: No supraclavicular or axillary adenopathy.  Skin:    General: Skin is warm and dry.  Neurological:     Mental Status: She is alert and oriented to person, place, and time.     Data Reviewed Cytology from the October 17, 2018 cyst aspiration:  Diagnosis NO MALIGNANT CELLS IDENTIFIED. ACUTE INFLAMMATION.  Repeat ultrasound was planned for today due to the incomplete resolution of the cystic lesions on her last visit and to reassess an incidental finding at the 9 o'clock position of the left breast.  In the 9 o'clock position left breast it appears that there are several small simple cyst measuring in aggregate 0.28 x 0.37 x 0.47 cm.  Posterior acoustic enhancement is noted today not evident on her last visit.  In the 2 o'clock position of the left breast where the complex  lesion as well as the deep simple cyst were identified examination today at the location 3 cm from nipple adjacent to the pectoralis fascia shows the simple cyst present measuring 0.53 x 0.87 x 1.03.  Previously this measured 0.96 x 0.57 x 1.65 cm.  The complex lesion noted superficial to this today only shows mild architectural distortion with no clear cystic component and the previously noted hyperemic rim having resolved.  No areas of increased vascular flow are noted.  This is consistent with inflamed cyst that has resolved post aspiration.  BI-RADS-2.  Assessment Benign breast exam post cyst aspiration.  Plan  Continue regular screening mammograms with PCP, due in December. Follow up as needed.   HPI, Physical Exam, Assessment and Plan have been scribed under the direction and in the presence of Wanda Bellow, MD  Wanda Living, LPN  I have completed the exam and reviewed the above documentation for accuracy and completeness.  I agree with the above.  Haematologist has been used and any errors in dictation or transcription are unintentional.  Wanda Hudson, M.D., F.A.C.S.  Wanda Hudson  11/16/2018, 4:49 PM

## 2019-01-26 ENCOUNTER — Ambulatory Visit (INDEPENDENT_AMBULATORY_CARE_PROVIDER_SITE_OTHER): Payer: BC Managed Care – PPO

## 2019-01-26 ENCOUNTER — Other Ambulatory Visit: Payer: Self-pay

## 2019-01-26 DIAGNOSIS — Z3042 Encounter for surveillance of injectable contraceptive: Secondary | ICD-10-CM

## 2019-01-26 MED ORDER — MEDROXYPROGESTERONE ACETATE 150 MG/ML IM SUSP
150.0000 mg | Freq: Once | INTRAMUSCULAR | Status: AC
  Administered 2019-01-26: 150 mg via INTRAMUSCULAR

## 2019-03-07 DIAGNOSIS — Z1231 Encounter for screening mammogram for malignant neoplasm of breast: Secondary | ICD-10-CM | POA: Diagnosis not present

## 2019-03-07 LAB — HM MAMMOGRAPHY

## 2019-03-20 ENCOUNTER — Other Ambulatory Visit: Payer: Self-pay

## 2019-03-20 DIAGNOSIS — Z20822 Contact with and (suspected) exposure to covid-19: Secondary | ICD-10-CM

## 2019-03-20 DIAGNOSIS — R6889 Other general symptoms and signs: Secondary | ICD-10-CM | POA: Diagnosis not present

## 2019-03-21 ENCOUNTER — Ambulatory Visit (INDEPENDENT_AMBULATORY_CARE_PROVIDER_SITE_OTHER): Payer: BC Managed Care – PPO | Admitting: Nurse Practitioner

## 2019-03-21 ENCOUNTER — Encounter: Payer: Self-pay | Admitting: Nurse Practitioner

## 2019-03-21 ENCOUNTER — Other Ambulatory Visit: Payer: Self-pay

## 2019-03-21 VITALS — BP 143/88 | HR 88 | Temp 98.8°F

## 2019-03-21 DIAGNOSIS — N898 Other specified noninflammatory disorders of vagina: Secondary | ICD-10-CM | POA: Diagnosis not present

## 2019-03-21 DIAGNOSIS — N760 Acute vaginitis: Secondary | ICD-10-CM | POA: Diagnosis not present

## 2019-03-21 DIAGNOSIS — B9689 Other specified bacterial agents as the cause of diseases classified elsewhere: Secondary | ICD-10-CM | POA: Diagnosis not present

## 2019-03-21 LAB — WET PREP FOR TRICH, YEAST, CLUE
Clue Cell Exam: POSITIVE — AB
Trichomonas Exam: NEGATIVE
Yeast Exam: NEGATIVE

## 2019-03-21 LAB — NOVEL CORONAVIRUS, NAA: SARS-CoV-2, NAA: NOT DETECTED

## 2019-03-21 MED ORDER — METRONIDAZOLE 500 MG PO TABS: 500.0000 mg | ORAL_TABLET | Freq: Two times a day (BID) | ORAL | 0 refills | Status: AC

## 2019-03-21 NOTE — Progress Notes (Signed)
BP (!) 143/88   Pulse 88   Temp 98.8 F (37.1 C) (Oral)   SpO2 99%    Subjective:    Patient ID: Wanda Hudson, female    DOB: 31-Aug-1970, 48 y.o.   MRN: [id number]  HPI: Wanda Hudson is a 48 y.o. female  Chief Complaint  Patient presents with  . Vaginal Discharge    pt states she has had vaginal discharge for about 4 days, states she also has some itching and odor as well    VAGINAL DISCHARGE Started 4 days ago.  Was having intercourse and noticed afterwards. Duration: days Discharge description: yellow  Pruritus: no Dysuria: no Malodorous: yes Urinary frequency: no Fevers: no Abdominal pain: no  Sexual activity: monogamous History of sexually transmitted diseases: no Recent antibiotic use: no Context: stable Treatments attempted: none  Relevant past medical, surgical, family and social history reviewed and updated as indicated. Interim medical history since our last visit reviewed. Allergies and medications reviewed and updated.  Review of Systems  Constitutional: Negative for activity change, appetite change, diaphoresis, fatigue and fever.  Respiratory: Negative for cough, chest tightness and shortness of breath.   Cardiovascular: Negative for chest pain, palpitations and leg swelling.  Gastrointestinal: Negative for abdominal distention, abdominal pain, constipation, diarrhea, nausea and vomiting.  Genitourinary: Positive for vaginal discharge. Negative for dysuria, frequency, urgency, vaginal bleeding and vaginal pain.  Psychiatric/Behavioral: Negative.     Per HPI unless specifically indicated above     Objective:    BP (!) 143/88   Pulse 88   Temp 98.8 F (37.1 C) (Oral)   SpO2 99%   Wt Readings from Last 3 Encounters:  11/16/18 124 lb (56.2 kg)  10/17/18 125 lb 9.6 oz (57 kg)  10/06/18 125 lb 12.8 oz (57.1 kg)    Physical Exam Vitals signs and nursing note reviewed.  Constitutional:      General: She is awake. She is not in acute  distress.    Appearance: She is well-developed. She is not ill-appearing.  HENT:     Head: Normocephalic.     Right Ear: Hearing normal.     Left Ear: Hearing normal.     Nose: Nose normal.     Mouth/Throat:     Mouth: Mucous membranes are moist.  Eyes:     General: Lids are normal.        Right eye: No discharge.        Left eye: No discharge.     Conjunctiva/sclera: Conjunctivae normal.     Pupils: Pupils are equal, round, and reactive to light.  Neck:     Musculoskeletal: Normal range of motion and neck supple.  Cardiovascular:     Rate and Rhythm: Normal rate and regular rhythm.     Heart sounds: Normal heart sounds. No murmur. No gallop.   Pulmonary:     Effort: Pulmonary effort is normal.     Breath sounds: Normal breath sounds.  Abdominal:     General: Bowel sounds are normal.     Palpations: Abdomen is soft.     Tenderness: There is no abdominal tenderness.  Genitourinary:    Comments: Obtained self swab. Musculoskeletal:     Right lower leg: No edema.     Left lower leg: No edema.  Skin:    General: Skin is warm and dry.  Neurological:     Mental Status: She is alert and oriented to person, place, and time.  Psychiatric:  Attention and Perception: Attention normal.        Mood and Affect: Mood normal.        Behavior: Behavior normal. Behavior is cooperative.        Thought Content: Thought content normal.        Judgment: Judgment normal.     Results for orders placed or performed in visit on 03/21/19  WET PREP FOR [place], YEAST, CLUE   Specimen: Vaginal; Sterile Swab   STERILE SWAB  Result Value Ref Range   Trichomonas Exam Negative Negative   Yeast Exam Negative Negative   Clue Cell Exam Positive (A) Negative      Assessment & Plan:   Problem List Items Addressed This Visit      Genitourinary   Bacterial vaginosis    Acute with wet prep noting clue cells, negative for trich or yeast.  Script for Flagyl sent and discussed hygiene techniques  for vaginal health.  Educated to take Flagyl with meal and not drink alcohol while taking.  Return for worsening or continued symptoms.      Relevant Medications   metroNIDAZOLE (FLAGYL) 500 MG tablet   Other Relevant Orders   WET PREP FOR [place], YEAST, CLUE (Completed)    Other Visit Diagnoses    Vaginal discharge    -  Primary       Follow up plan: Return if symptoms worsen or fail to improve.

## 2019-03-21 NOTE — Assessment & Plan Note (Signed)
Acute with wet prep noting clue cells, negative for trich or yeast.  Script for Flagyl sent and discussed hygiene techniques for vaginal health.  Educated to take Flagyl with meal and not drink alcohol while taking.  Return for worsening or continued symptoms.

## 2019-03-21 NOTE — Patient Instructions (Signed)

## 2019-03-22 ENCOUNTER — Telehealth: Payer: Self-pay

## 2019-03-22 NOTE — Telephone Encounter (Signed)
Pt. Called back, given COVID 19 results.

## 2019-04-16 ENCOUNTER — Ambulatory Visit (INDEPENDENT_AMBULATORY_CARE_PROVIDER_SITE_OTHER): Payer: BC Managed Care – PPO

## 2019-04-16 ENCOUNTER — Other Ambulatory Visit: Payer: Self-pay

## 2019-04-16 ENCOUNTER — Other Ambulatory Visit: Payer: Self-pay | Admitting: Nurse Practitioner

## 2019-04-16 DIAGNOSIS — Z23 Encounter for immunization: Secondary | ICD-10-CM

## 2019-04-16 DIAGNOSIS — Z3042 Encounter for surveillance of injectable contraceptive: Secondary | ICD-10-CM

## 2019-04-16 MED ORDER — MEDROXYPROGESTERONE ACETATE 150 MG/ML IM SUSP
150.0000 mg | Freq: Once | INTRAMUSCULAR | Status: AC
  Administered 2019-04-16: 150 mg via INTRAMUSCULAR

## 2019-05-10 ENCOUNTER — Ambulatory Visit: Payer: BC Managed Care – PPO

## 2019-06-18 ENCOUNTER — Other Ambulatory Visit: Payer: Self-pay | Admitting: Unknown Physician Specialty

## 2019-06-18 NOTE — Telephone Encounter (Signed)
Requested medication (s) are due for refill today: yes  Requested medication (s) are on the active medication list: yes  Last refill:  05/12/2019  Future visit scheduled: yes  Notes to clinic:  Last filled by historical provider    Requested Prescriptions  Pending Prescriptions Disp Refills   amLODipine (NORVASC) 5 MG tablet [Pharmacy Med Name: AMLODIPINE BESYLATE 5 MG TAB] 90 tablet 3    Sig: TAKE 1 TABLET BY MOUTH EVERY DAY     Cardiovascular:  Calcium Channel Blockers Failed - 06/18/2019  1:12 AM      Failed - Last BP in normal range    BP Readings from Last 1 Encounters:  03/21/19 (!) 143/88         Passed - Valid encounter within last 6 months    Recent Outpatient Visits          2 months ago Vaginal discharge   [place], Henrine Screws T, NP   8 months ago Breast cyst, left   [facility] Volney American, [place]   11 months ago Encounter for surveillance of injectable contraceptive   [place], Kalona T, NP   1 year ago Annual physical exam   [facility] Kathrine Haddock, NP   1 year ago Benign hypertension   [place] Kathrine Haddock, NP      Future Appointments            In 4 days [place], Barbaraann Faster, NP [employer]

## 2019-06-21 ENCOUNTER — Other Ambulatory Visit: Payer: Self-pay

## 2019-06-22 ENCOUNTER — Encounter: Payer: Self-pay | Admitting: Nurse Practitioner

## 2019-06-22 ENCOUNTER — Other Ambulatory Visit: Payer: Self-pay

## 2019-06-22 ENCOUNTER — Other Ambulatory Visit (HOSPITAL_COMMUNITY)
Admission: RE | Admit: 2019-06-22 | Discharge: 2019-06-22 | Disposition: A | Discharge status: Home / Self Care | Payer: BC Managed Care – PPO | Source: Ambulatory Visit | Attending: Nurse Practitioner | Admitting: Nurse Practitioner

## 2019-06-22 ENCOUNTER — Telehealth: Payer: Self-pay

## 2019-06-22 ENCOUNTER — Ambulatory Visit (INDEPENDENT_AMBULATORY_CARE_PROVIDER_SITE_OTHER): Payer: BC Managed Care – PPO | Admitting: Nurse Practitioner

## 2019-06-22 VITALS — BP 137/84 | HR 93 | Temp 98.6°F | Ht 66.0 in | Wt 126.0 lb

## 2019-06-22 DIAGNOSIS — E538 Deficiency of other specified B group vitamins: Secondary | ICD-10-CM | POA: Diagnosis not present

## 2019-06-22 DIAGNOSIS — I10 Essential (primary) hypertension: Secondary | ICD-10-CM

## 2019-06-22 DIAGNOSIS — Z0000 Encounter for general adult medical examination without abnormal findings: Secondary | ICD-10-CM

## 2019-06-22 DIAGNOSIS — E559 Vitamin D deficiency, unspecified: Secondary | ICD-10-CM | POA: Diagnosis not present

## 2019-06-22 DIAGNOSIS — F411 Generalized anxiety disorder: Secondary | ICD-10-CM | POA: Diagnosis not present

## 2019-06-22 DIAGNOSIS — Z8742 Personal history of other diseases of the female genital tract: Secondary | ICD-10-CM

## 2019-06-22 DIAGNOSIS — D171 Benign lipomatous neoplasm of skin and subcutaneous tissue of trunk: Secondary | ICD-10-CM | POA: Insufficient documentation

## 2019-06-22 DIAGNOSIS — Z1211 Encounter for screening for malignant neoplasm of colon: Secondary | ICD-10-CM

## 2019-06-22 MED ORDER — SERTRALINE HCL 25 MG PO TABS: 25.0000 mg | ORAL_TABLET | Freq: Every day | ORAL | 5 refills | Status: DC

## 2019-06-22 MED ORDER — BUSPIRONE HCL 5 MG PO TABS: 5.0000 mg | ORAL_TABLET | Freq: Two times a day (BID) | ORAL | 3 refills | Status: DC | PRN

## 2019-06-22 NOTE — Assessment & Plan Note (Signed)
Chronic, stable with BP at goal.  Continue current regimen and adjust as needed.  Obtain labs today.

## 2019-06-22 NOTE — Progress Notes (Signed)
BP 137/84   Pulse 93   Temp 98.6 F (37 C) (Oral)   Ht 5\' 6"  (1.676 m)   Wt 126 lb (57.2 kg)   SpO2 97%   BMI 20.34 kg/m    Subjective:    Patient ID: Wanda Hudson, female    DOB: 01-10-1971, 48 y.o.   MRN: [id number]  HPI: Wanda Hudson is a 48 y.o. female presenting on 06/22/2019 for comprehensive medical examination. Current medical complaints include:none  She currently lives with: significant other Menopausal Symptoms: none  HYPERTENSION Hypertension status: stable  Satisfied with current treatment? yes Duration of hypertension: chronic BP monitoring frequency:  a few times a week BP range: 120-130/80's BP medication side effects:  no Medication compliance: good compliance Aspirin: no Recurrent headaches: no Visual changes: no Palpitations: no Dyspnea: no Chest pain: no Lower extremity edema: no Dizzy/lightheaded: no   ANXIETY/STRESS Endorses that the pandemic is "getting to me", as can not see her mother who is in nursing.  In past has taken Lexapro, Wellbutrin, and Ativan. Duration:exacerbated Anxious mood: yes  Excessive worrying: yes Irritability: no  Sweating: no Nausea: no Palpitations:no Hyperventilation: no Panic attacks: yes Agoraphobia: no  Obscessions/compulsions: no Depressed mood: no Depression screen [facility] 2/9 06/22/2019 06/16/2018 05/17/2017 05/14/2016 05/12/2015  Decreased Interest 1 0 0 0 0  Down, Depressed, Hopeless 2 0 0 0 0  PHQ - 2 Score 3 0 0 0 0  Altered sleeping 2 0 0 1 -  Tired, decreased energy 1 0 0 0 -  Change in appetite 0 0 0 1 -  Feeling bad or failure about yourself  0 0 0 0 -  Trouble concentrating 0 0 0 0 -  Moving slowly or fidgety/restless 0 0 0 0 -  Suicidal thoughts 0 0 0 0 -  PHQ-9 Score 6 0 0 2 -  Difficult doing work/chores Somewhat difficult - - - -   Anhedonia: no Weight changes: no Insomnia: yes hard to stay asleep  Hypersomnia: no Fatigue/loss of energy: no Feelings of worthlessness: no Feelings  of guilt: no Impaired concentration/indecisiveness: yes Suicidal ideations: no  Crying spells: no Recent Stressors/Life Changes: yes   Relationship problems: no   Family stress: yes     Financial stress: no    Job stress: no    Recent death/loss: no   Depression Screen done today and results listed below:  Depression screen [facility] 2/9 06/22/2019 06/16/2018 05/17/2017 05/14/2016 05/12/2015  Decreased Interest 1 0 0 0 0  Down, Depressed, Hopeless 2 0 0 0 0  PHQ - 2 Score 3 0 0 0 0  Altered sleeping 2 0 0 1 -  Tired, decreased energy 1 0 0 0 -  Change in appetite 0 0 0 1 -  Feeling bad or failure about yourself  0 0 0 0 -  Trouble concentrating 0 0 0 0 -  Moving slowly or fidgety/restless 0 0 0 0 -  Suicidal thoughts 0 0 0 0 -  PHQ-9 Score 6 0 0 2 -  Difficult doing work/chores Somewhat difficult - - - -    The patient does not have a history of falls. I did not complete a risk assessment for falls. A plan of care for falls was not documented.   Past Medical History:  Past Medical History:  Diagnosis Date  . Anxiety   . Breast cyst, left 2017  . Depression   . Insomnia   . Osteopenia   . Peripheral neuropathy  Surgical History:  Past Surgical History:  Procedure Laterality Date  . BREAST CYST ASPIRATION Left 2017   [facility]  . 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 DG SALIVARY GLANDS ([place] HX)    . TUMOR REMOVAL     ears    Medications:  Current Outpatient Medications on File Prior to Visit  Medication Sig  . Alpha-Lipoic Acid 100 MG TABS Take 1 tablet by mouth daily.  Marland Kitchen amLODipine (NORVASC) 5 MG tablet TAKE 1 TABLET BY MOUTH EVERY DAY  . Aspirin-Salicylamide-Caffeine (BC FAST PAIN RELIEF) 650-195-33.3 MG PACK Take by mouth as needed.  . Evening Primrose Oil 500 MG CAPS Take 500 mg by mouth daily.  Marland Kitchen sulfacetamide (BLEPH-10) 10 % ophthalmic solution APPLY 4 DROPS TO DRAINING EAR TWICE DAILY FOR 2 WEEKS  . Vitamin D, Cholecalciferol, 1000 UNITS TABS Take  1,000 Units by mouth daily.   No current facility-administered medications on file prior to visit.     Allergies:  Allergies  Allergen Reactions  . Codeine Rash    Social History:  Social History   Socioeconomic History  . Marital status: Single    Spouse name: Not on file  . Number of children: Not on file  . Years of education: Not on file  . Highest education level: Not on file  Occupational History  . Not on file  Social Needs  . Financial resource strain: Not on file  . Food insecurity    Worry: Not on file    Inability: Not on file  . Transportation needs    Medical: Not on file    Non-medical: Not on file  Tobacco Use  . Smoking status: Current Some Day Smoker    Packs/day: 0.50    Years: 21.00    Pack years: 10.50    Types: Cigarettes  . Smokeless tobacco: Never Used  Substance and Sexual Activity  . Alcohol use: No    Comment: occasionally  . Drug use: No  . Sexual activity: Yes    Birth control/protection: Injection  Lifestyle  . Physical activity    Days per week: Not on file    Minutes per session: Not on file  . Stress: Not on file  Relationships  . Social Herbalist on phone: Not on file    Gets together: Not on file    Attends religious service: Not on file    Active member of club or organization: Not on file    Attends meetings of clubs or organizations: Not on file    Relationship status: Not on file  . Intimate partner violence    Fear of current or ex partner: Not on file    Emotionally abused: Not on file    Physically abused: Not on file    Forced sexual activity: Not on file  Other Topics Concern  . Not on file  Social History Narrative  . Not on file   Social History   Tobacco Use  Smoking Status Current Some Day Smoker  . Packs/day: 0.50  . Years: 21.00  . Pack years: 10.50  . Types: Cigarettes  Smokeless Tobacco Never Used   Social History   Substance and Sexual Activity  Alcohol Use No   Comment:  occasionally    Family History:  Family History  Problem Relation Age of Onset  . Diabetes Mother   . Hypertension Mother   . Stroke Mother   . Diabetes Father   . Hypertension Father   .  Lung cancer Father   . Heart disease Maternal Grandfather        MI  . Heart disease Paternal Grandfather        MI  . Hypertension Brother   . Aneurysm Maternal Grandmother   . Breast cancer Neg Hx     Past medical history, surgical history, medications, allergies, family history and social history reviewed with patient today and changes made to appropriate areas of the chart.   Review of Systems -  negative All other ROS negative except what is listed above and in the HPI.      Objective:    BP 137/84   Pulse 93   Temp 98.6 F (37 C) (Oral)   Ht 5\' 6"  (1.676 m)   Wt 126 lb (57.2 kg)   SpO2 97%   BMI 20.34 kg/m   Wt Readings from Last 3 Encounters:  06/22/19 126 lb (57.2 kg)  11/16/18 124 lb (56.2 kg)  10/17/18 125 lb 9.6 oz (57 kg)    Physical Exam Constitutional:      General: She is awake. She is not in acute distress.    Appearance: She is well-developed. She is not ill-appearing.  HENT:     Head: Normocephalic and atraumatic.     Right Ear: Hearing, tympanic membrane, ear canal and external ear normal. No drainage.     Left Ear: Hearing, tympanic membrane, ear canal and external ear normal. No drainage.     Nose: Nose normal.     Right Sinus: No maxillary sinus tenderness or frontal sinus tenderness.     Left Sinus: No maxillary sinus tenderness or frontal sinus tenderness.     Mouth/Throat:     Mouth: Mucous membranes are moist.     Pharynx: Oropharynx is clear. Uvula midline. No pharyngeal swelling, oropharyngeal exudate or posterior oropharyngeal erythema.  Eyes:     General: Lids are normal.        Right eye: No discharge.        Left eye: No discharge.     Extraocular Movements: Extraocular movements intact.     Conjunctiva/sclera: Conjunctivae normal.      Pupils: Pupils are equal, round, and reactive to light.     Visual Fields: Right eye visual fields normal and left eye visual fields normal.  Neck:     Musculoskeletal: Normal range of motion and neck supple.     Thyroid: No thyromegaly.     Vascular: No carotid bruit.     Trachea: Trachea normal.  Cardiovascular:     Rate and Rhythm: Normal rate and regular rhythm.     Heart sounds: Normal heart sounds. No murmur. No gallop.   Pulmonary:     Effort: Pulmonary effort is normal. No accessory muscle usage or respiratory distress.     Breath sounds: Normal breath sounds.  Chest:     Breasts:        Right: Normal.        Left: Normal.  Abdominal:     General: Bowel sounds are normal.     Palpations: Abdomen is soft. There is no hepatomegaly or splenomegaly.     Tenderness: There is no abdominal tenderness.     Hernia: There is no hernia in the left inguinal area or right inguinal area.    Genitourinary:    Exam position: Lithotomy position.     Vagina: Normal.     Cervix: Friability present.     Uterus: Normal.  Adnexa: Right adnexa normal and left adnexa normal.     Comments: Cervix anterior with friability, scant bleeding with pap. Musculoskeletal: Normal range of motion.     Right lower leg: No edema.     Left lower leg: No edema.  Lymphadenopathy:     Head:     Right side of head: No submental, submandibular, tonsillar, preauricular or posterior auricular adenopathy.     Left side of head: No submental, submandibular, tonsillar, preauricular or posterior auricular adenopathy.     Cervical: No cervical adenopathy.     Upper Body:     Right upper body: No supraclavicular, axillary or pectoral adenopathy.     Left upper body: No supraclavicular, axillary or pectoral adenopathy.  Skin:    General: Skin is warm and dry.     Capillary Refill: Capillary refill takes less than 2 seconds.     Findings: No rash.  Neurological:     Mental Status: She is alert and oriented to  person, place, and time.     Cranial Nerves: Cranial nerves are intact.     Gait: Gait is intact.     Deep Tendon Reflexes: Reflexes are normal and symmetric.     Reflex Scores:      Brachioradialis reflexes are 2+ on the right side and 2+ on the left side.      Patellar reflexes are 2+ on the right side and 2+ on the left side. Psychiatric:        Attention and Perception: Attention normal.        Mood and Affect: Mood normal.        Speech: Speech normal.        Behavior: Behavior normal. Behavior is cooperative.        Thought Content: Thought content normal.        Judgment: Judgment normal.     Results for orders placed or performed in visit on 03/21/19  WET PREP FOR [place], YEAST, CLUE   Specimen: Vaginal; Sterile Swab   STERILE SWAB  Result Value Ref Range   Trichomonas Exam Negative Negative   Yeast Exam Negative Negative   Clue Cell Exam Positive (A) Negative      Assessment & Plan:   Problem List Items Addressed This Visit      Cardiovascular and Mediastinum   Benign hypertension    Chronic, stable with BP at goal.  Continue current regimen and adjust as needed.  Obtain labs today.        Other   History of abnormal cervical Pap smear    Repeat pap today and if positive HPV refer to GYN.      Relevant Orders   Cytology - PAP   B12 deficiency    History of level <300 and reports neuropathy discomfort.  Recheck level today and CBC.  Recommend starting Vitamin B12 orally 1000 MCG daily.      Relevant Orders   Vitamin B12   Generalized anxiety disorder    Ongoing, with recent exacerbation. Script for Sertraline 25 MG sent and adjust dose as needed + Buspar to take as needed for severe anxiety while transitioning to daily Sertraline.  Denies SI/HI.  Return in 4 weeks for f/u.      Relevant Medications   sertraline (ZOLOFT) 25 MG tablet   busPIRone (BUSPAR) 5 MG tablet   Vitamin D deficiency    Reports history of low level, check today and continue current  supplement daily.  Relevant Orders   Vit D  25 hydroxy (rtn osteoporosis monitoring)   Lipoma of skin of abdomen    Continue to monitor left mid-abdomen.  No tenderness of signs infection today.  If increase size or s/s infection refer to general surgery.       Other Visit Diagnoses    Annual physical exam    -  Primary   Relevant Orders   CBC with Differential/Platelet out   Comprehensive metabolic panel   Lipid Panel w/o Chol/HDL Ratio out   TSH   Colon cancer screening       GI referral placed   Relevant Orders   Ambulatory referral to Gastroenterology       Follow up plan: Return in about 4 weeks (around 07/20/2019) for Anxiety.   LABORATORY TESTING:  - Pap smear: pap done  IMMUNIZATIONS:   - Tdap: Tetanus vaccination status reviewed: last tetanus booster 4 years ago. - Influenza: Up to date - Pneumovax: Not applicable - Prevnar: Not applicable - HPV: Not applicable - Zostavax vaccine: Refused  SCREENING: -Mammogram: Up to date  - Colonoscopy: Ordered today  - Bone Density: Not applicable  -Hearing Test: Not applicable  -Spirometry: Not applicable   PATIENT COUNSELING:   Advised to take 1 mg of folate supplement per day if capable of pregnancy.   Sexuality: Discussed sexually transmitted diseases, partner selection, use of condoms, avoidance of unintended pregnancy  and contraceptive alternatives.   Advised to avoid cigarette smoking.  I discussed with the patient that most people either abstain from alcohol or drink within safe limits (<=14/week and <=4 drinks/occasion for males, <=7/weeks and <= 3 drinks/occasion for females) and that the risk for alcohol disorders and other health effects rises proportionally with the number of drinks per week and how often a drinker exceeds daily limits.  Discussed cessation/primary prevention of drug use and availability of treatment for abuse.   Diet: Encouraged to adjust caloric intake to maintain  or achieve  ideal body weight, to reduce intake of dietary saturated fat and total fat, to limit sodium intake by avoiding high sodium foods and not adding table salt, and to maintain adequate dietary potassium and calcium preferably from fresh fruits, vegetables, and low-fat dairy products.    stressed the importance of regular exercise  Injury prevention: Discussed safety belts, safety helmets, smoke detector, smoking near bedding or upholstery.   Dental health: Discussed importance of regular tooth brushing, flossing, and dental visits.    NEXT PREVENTATIVE PHYSICAL DUE IN 1 YEAR. Return in about 4 weeks (around 07/20/2019) for Anxiety.

## 2019-06-22 NOTE — Telephone Encounter (Signed)
Gastroenterology Pre-Procedure Review  Request Date: Friday 06/29/19 Requesting Physician: Dr. Allen Norris  PATIENT REVIEW QUESTIONS: The patient responded to the following health history questions as indicated:    1. Are you having any GI issues? no 2. Do you have a personal history of Polyps? no 3. Do you have a family history of Colon Cancer or Polyps? no 4. Diabetes Mellitus? no 5. Joint replacements in the past 12 months?no 6. Major health problems in the past 3 months?no 7. Any artificial heart valves, MVP, or defibrillator?no    MEDICATIONS & ALLERGIES:    Patient reports the following regarding taking any anticoagulation/antiplatelet therapy:   Plavix, Coumadin, Eliquis, Xarelto, Lovenox, Pradaxa, Brilinta, or Effient? no Aspirin? no  Patient confirms/reports the following medications:  Current Outpatient Medications  Medication Sig Dispense Refill  . Alpha-Lipoic Acid 100 MG TABS Take 1 tablet by mouth daily.    Marland Kitchen amLODipine (NORVASC) 5 MG tablet TAKE 1 TABLET BY MOUTH EVERY DAY 90 tablet 3  . Aspirin-Salicylamide-Caffeine (BC FAST PAIN RELIEF) 650-195-33.3 MG PACK Take by mouth as needed.    . busPIRone (BUSPAR) 5 MG tablet Take 1 tablet (5 mg total) by mouth 2 (two) times daily as needed. 60 tablet 3  . Evening Primrose Oil 500 MG CAPS Take 500 mg by mouth daily.    . sertraline (ZOLOFT) 25 MG tablet Take 1 tablet (25 mg total) by mouth daily. 30 tablet 5  . sulfacetamide (BLEPH-10) 10 % ophthalmic solution APPLY 4 DROPS TO DRAINING EAR TWICE DAILY FOR 2 WEEKS    . Vitamin D, Cholecalciferol, 1000 UNITS TABS Take 1,000 Units by mouth daily.     No current facility-administered medications for this visit.     Patient confirms/reports the following allergies:  Allergies  Allergen Reactions  . Codeine Rash    No orders of the defined types were placed in this encounter.   AUTHORIZATION INFORMATION Primary Insurance: 1D#: Group #:  Secondary Insurance: 1D#: Group  #:  SCHEDULE INFORMATION: Date: Friday 06/29/19 Time: Location:[facility]

## 2019-06-22 NOTE — Assessment & Plan Note (Signed)
History of level <300 and reports neuropathy discomfort.  Recheck level today and CBC.  Recommend starting Vitamin B12 orally 1000 MCG daily.

## 2019-06-22 NOTE — Assessment & Plan Note (Signed)
Continue to monitor left mid-abdomen.  No tenderness of signs infection today.  If increase size or s/s infection refer to general surgery.

## 2019-06-22 NOTE — Patient Instructions (Signed)
Take Vitamin B12 1000 MCG a day  Vitamin B12 Deficiency Vitamin B12 deficiency occurs when the body does not have enough vitamin B12, which is an important vitamin. The body needs this vitamin:  To make red blood cells.  To make DNA. This is the genetic material inside cells.  To help the nerves work properly so they can carry messages from the brain to the body. Vitamin B12 deficiency can cause various health problems, such as a low red blood cell count (anemia) or nerve damage. What are the causes? This condition may be caused by:  Not eating enough foods that contain vitamin B12.  Not having enough stomach acid and digestive fluids to properly absorb vitamin B12 from the food that you eat.  Certain digestive system diseases that make it hard to absorb vitamin B12. These diseases include Crohn's disease, chronic pancreatitis, and cystic fibrosis.  A condition in which the body does not make enough of a protein (intrinsic factor), resulting in too few red blood cells (pernicious anemia).  Having a surgery in which part of the stomach or small intestine is removed.  Taking certain medicines that make it hard for the body to absorb vitamin B12. These medicines include: ? Heartburn medicines (antacids and proton pump inhibitors). ? Certain antibiotic medicines. ? Some medicines that are used to treat diabetes, tuberculosis, gout, or high cholesterol. What increases the risk? The following factors may make you more likely to develop a B12 deficiency:  Being older than age 82.  Eating a vegetarian or vegan diet, especially while you are pregnant.  Eating a poor diet while you are pregnant.  Taking certain medicines.  Having alcoholism. What are the signs or symptoms? In some cases, there are no symptoms of this condition. If the condition leads to anemia or nerve damage, various symptoms can occur, such as:  Weakness.  Fatigue.  Loss of appetite.  Weight loss.  Numbness  or tingling in your hands and feet.  Redness and burning of the tongue.  Confusion or memory problems.  Depression.  Sensory problems, such as color blindness, ringing in the ears, or loss of taste.  Diarrhea or constipation.  Trouble walking. If anemia is severe, symptoms can include:  Shortness of breath.  Dizziness.  Rapid heart rate (tachycardia). How is this diagnosed? This condition may be diagnosed with a blood test to measure the level of vitamin B12 in your blood. You may also have other tests, including:  A group of tests that measure certain characteristics of blood cells (complete blood count, CBC).  A blood test to measure intrinsic factor.  A procedure where a thin tube with a camera on the end is used to look into your stomach or intestines (endoscopy). Other tests may be needed to discover the cause of B12 deficiency. How is this treated? Treatment for this condition depends on the cause. This condition may be treated by:  Changing your eating and drinking habits, such as: ? Eating more foods that contain vitamin B12. ? Drinking less alcohol or no alcohol.  Getting vitamin B12 injections.  Taking vitamin B12 supplements. Your health care provider will tell you which dosage is best for you. Follow these instructions at home: Eating and drinking   Eat lots of healthy foods that contain vitamin B12, including: ? Meats and poultry. This includes beef, pork, chicken, [place], and organ meats, such as liver. ? Seafood. This includes clams, rainbow trout, salmon, tuna, and haddock. ? Eggs. ? Cereal and dairy products  that are fortified. This means that vitamin B12 has been added to the food. Check the label on the package to see if the food is fortified. The items listed above may not be a complete list of recommended foods and beverages. Contact a dietitian for more information. General instructions  Get any injections that are prescribed by your health  care provider.  Take supplements only as told by your health care provider. Follow the directions carefully.  Do not drink alcohol if your health care provider tells you not to. In some cases, you may only be asked to limit alcohol use.  Keep all follow-up visits as told by your health care provider. This is important. Contact a health care provider if:  Your symptoms come back. Get help right away if you:  Develop shortness of breath.  Have a rapid heart rate.  Have chest pain.  Become dizzy or lose consciousness. Summary  Vitamin B12 deficiency occurs when the body does not have enough vitamin B12.  The main causes of vitamin B12 deficiency include dietary deficiency, digestive diseases, pernicious anemia, and having a surgery in which part of the stomach or small intestine is removed.  In some cases, there are no symptoms of this condition. If the condition leads to anemia or nerve damage, various symptoms can occur, such as weakness, shortness of breath, and numbness.  Treatment may include getting vitamin B12 injections or taking vitamin B12 supplements. Eat lots of healthy foods that contain vitamin B12. This information is not intended to replace advice given to you by your health care provider. Make sure you discuss any questions you have with your health care provider. Document Released: 10/25/2011 Document Revised: 04/11/2018 Document Reviewed: 04/11/2018 Elsevier Patient Education  2020 [employer].

## 2019-06-22 NOTE — Assessment & Plan Note (Addendum)
Reports history of low level, check today and continue current supplement daily.

## 2019-06-22 NOTE — Assessment & Plan Note (Signed)
Ongoing, with recent exacerbation. Script for Sertraline 25 MG sent and adjust dose as needed + Buspar to take as needed for severe anxiety while transitioning to daily Sertraline.  Denies SI/HI.  Return in 4 weeks for f/u.

## 2019-06-22 NOTE — Assessment & Plan Note (Signed)
Repeat pap today and if positive HPV refer to GYN.

## 2019-06-23 LAB — COMPREHENSIVE METABOLIC PANEL
ALT: 11 IU/L (ref 0–32)
AST: 18 IU/L (ref 0–40)
Albumin/Globulin Ratio: 1.3 (ref 1.2–2.2)
Albumin: 4.2 g/dL (ref 3.8–4.8)
Alkaline Phosphatase: 114 IU/L (ref 39–117)
BUN/Creatinine Ratio: 22 (ref 9–23)
BUN: 11 mg/dL (ref 6–24)
Bilirubin Total: 0.4 mg/dL (ref 0.0–1.2)
CO2: 23 mmol/L (ref 20–29)
Calcium: 9.6 mg/dL (ref 8.7–10.2)
Chloride: 104 mmol/L (ref 96–106)
Creatinine, Ser: 0.51 mg/dL — ABNORMAL LOW (ref 0.57–1.00)
GFR calc Af Amer: 130 mL/min/{1.73_m2} (ref >59)
GFR calc non Af Amer: 112 mL/min/{1.73_m2} (ref >59)
Globulin, Total: 3.3 g/dL (ref 1.5–4.5)
Glucose: 81 mg/dL (ref 65–99)
Potassium: 4.4 mmol/L (ref 3.5–5.2)
Sodium: 142 mmol/L (ref 134–144)
Total Protein: 7.5 g/dL (ref 6.0–8.5)

## 2019-06-23 LAB — CBC WITH DIFFERENTIAL/PLATELET
Basophils Absolute: 0.1 10*3/uL (ref 0.0–0.2)
Basos: 1 %
EOS (ABSOLUTE): 0.1 10*3/uL (ref 0.0–0.4)
Eos: 1 %
Hematocrit: 44 % (ref 34.0–46.6)
Hemoglobin: 14 g/dL (ref 11.1–15.9)
Immature Grans (Abs): 0 10*3/uL (ref 0.0–0.1)
Immature Granulocytes: 0 %
Lymphocytes Absolute: 2.5 10*3/uL (ref 0.7–3.1)
Lymphs: 26 %
MCH: 28.2 pg (ref 26.6–33.0)
MCHC: 31.8 g/dL (ref 31.5–35.7)
MCV: 89 fL (ref 79–97)
Monocytes Absolute: 0.9 10*3/uL (ref 0.1–0.9)
Monocytes: 9 %
Neutrophils Absolute: 6.1 10*3/uL (ref 1.4–7.0)
Neutrophils: 63 %
Platelets: 361 10*3/uL (ref 150–450)
RBC: 4.97 x10E6/uL (ref 3.77–5.28)
RDW: 12.4 % (ref 11.7–15.4)
WBC: 9.8 10*3/uL (ref 3.4–10.8)

## 2019-06-23 LAB — LIPID PANEL W/O CHOL/HDL RATIO
Cholesterol, Total: 168 mg/dL (ref 100–199)
HDL: 44 mg/dL (ref >39)
LDL Chol Calc (NIH): 107 mg/dL — ABNORMAL HIGH (ref 0–99)
Triglycerides: 93 mg/dL (ref 0–149)
VLDL Cholesterol Cal: 17 mg/dL (ref 5–40)

## 2019-06-23 LAB — TSH: TSH: 1.05 u[IU]/mL (ref 0.450–4.500)

## 2019-06-23 LAB — VITAMIN B12: Vitamin B-12: 221 pg/mL — ABNORMAL LOW (ref 232–1245)

## 2019-06-23 LAB — VITAMIN D 25 HYDROXY (VIT D DEFICIENCY, FRACTURES): Vit D, 25-Hydroxy: 15.8 ng/mL — ABNORMAL LOW (ref 30.0–100.0)

## 2019-06-26 ENCOUNTER — Other Ambulatory Visit
Admission: RE | Admit: 2019-06-26 | Discharge: 2019-06-26 | Disposition: A | Discharge status: Home / Self Care | Payer: BC Managed Care – PPO | Source: Ambulatory Visit | Attending: Gastroenterology | Admitting: Gastroenterology

## 2019-06-26 DIAGNOSIS — Z01812 Encounter for preprocedural laboratory examination: Secondary | ICD-10-CM | POA: Insufficient documentation

## 2019-06-26 DIAGNOSIS — Z20828 Contact with and (suspected) exposure to other viral communicable diseases: Secondary | ICD-10-CM | POA: Diagnosis not present

## 2019-06-26 LAB — SARS CORONAVIRUS 2 (TAT 6-24 HRS): SARS Coronavirus 2: NEGATIVE

## 2019-06-28 ENCOUNTER — Encounter: Payer: Self-pay | Admitting: *Deleted

## 2019-06-28 ENCOUNTER — Other Ambulatory Visit: Payer: Self-pay

## 2019-06-28 LAB — CYTOLOGY - PAP
Adequacy: ABNORMAL
Comment: NEGATIVE
High risk HPV: NEGATIVE

## 2019-06-28 MED ORDER — PEG 3350-KCL-NA BICARB-NACL 420 G PO SOLR: ORAL | 0 refills | Status: DC

## 2019-06-29 ENCOUNTER — Ambulatory Visit: Payer: BC Managed Care – PPO | Admitting: Anesthesiology

## 2019-06-29 ENCOUNTER — Other Ambulatory Visit: Payer: Self-pay

## 2019-06-29 ENCOUNTER — Ambulatory Visit
Admission: RE | Admit: 2019-06-29 | Discharge: 2019-06-29 | Disposition: A | Discharge status: Home / Self Care | Payer: BC Managed Care – PPO | Source: Ambulatory Visit | Attending: Gastroenterology | Admitting: Gastroenterology

## 2019-06-29 ENCOUNTER — Encounter
Admission: RE | Disposition: A | Discharge status: Home / Self Care | Payer: Self-pay | Source: Ambulatory Visit | Attending: Gastroenterology

## 2019-06-29 DIAGNOSIS — M8580 Other specified disorders of bone density and structure, unspecified site: Secondary | ICD-10-CM | POA: Diagnosis not present

## 2019-06-29 DIAGNOSIS — G709 Myoneural disorder, unspecified: Secondary | ICD-10-CM | POA: Diagnosis not present

## 2019-06-29 DIAGNOSIS — Z7982 Long term (current) use of aspirin: Secondary | ICD-10-CM | POA: Insufficient documentation

## 2019-06-29 DIAGNOSIS — Z1211 Encounter for screening for malignant neoplasm of colon: Secondary | ICD-10-CM | POA: Insufficient documentation

## 2019-06-29 DIAGNOSIS — F17210 Nicotine dependence, cigarettes, uncomplicated: Secondary | ICD-10-CM | POA: Insufficient documentation

## 2019-06-29 DIAGNOSIS — Z8249 Family history of ischemic heart disease and other diseases of the circulatory system: Secondary | ICD-10-CM | POA: Insufficient documentation

## 2019-06-29 DIAGNOSIS — F329 Major depressive disorder, single episode, unspecified: Secondary | ICD-10-CM | POA: Diagnosis not present

## 2019-06-29 DIAGNOSIS — F419 Anxiety disorder, unspecified: Secondary | ICD-10-CM | POA: Diagnosis not present

## 2019-06-29 DIAGNOSIS — Z79899 Other long term (current) drug therapy: Secondary | ICD-10-CM | POA: Insufficient documentation

## 2019-06-29 DIAGNOSIS — K640 First degree hemorrhoids: Secondary | ICD-10-CM | POA: Diagnosis not present

## 2019-06-29 DIAGNOSIS — K635 Polyp of colon: Secondary | ICD-10-CM | POA: Diagnosis not present

## 2019-06-29 DIAGNOSIS — I10 Essential (primary) hypertension: Secondary | ICD-10-CM | POA: Diagnosis not present

## 2019-06-29 DIAGNOSIS — F418 Other specified anxiety disorders: Secondary | ICD-10-CM | POA: Diagnosis not present

## 2019-06-29 HISTORY — PX: COLONOSCOPY WITH PROPOFOL: SHX5780

## 2019-06-29 HISTORY — DX: Essential (primary) hypertension: I10

## 2019-06-29 LAB — POCT PREGNANCY, URINE: Preg Test, Ur: NEGATIVE

## 2019-06-29 SURGERY — COLONOSCOPY WITH PROPOFOL
Anesthesia: General

## 2019-06-29 MED ORDER — SODIUM CHLORIDE 0.9 % IV SOLN
INTRAVENOUS | Status: DC
  Administered 2019-06-29: 09:00:00 via INTRAVENOUS

## 2019-06-29 MED ORDER — PROPOFOL 500 MG/50ML IV EMUL
INTRAVENOUS | Status: DC | PRN
  Administered 2019-06-29: 140 ug/kg/min via INTRAVENOUS

## 2019-06-29 MED ORDER — PROPOFOL 10 MG/ML IV BOLUS
INTRAVENOUS | Status: DC | PRN
  Administered 2019-06-29: 70 mg via INTRAVENOUS

## 2019-06-29 NOTE — Anesthesia Postprocedure Evaluation (Signed)
Anesthesia Post Note  Patient: Wanda Hudson  Procedure(s) Performed: COLONOSCOPY WITH PROPOFOL (N/A )  Patient location during evaluation: Endoscopy Anesthesia Type: General Level of consciousness: awake and alert Pain management: pain level controlled Vital Signs Assessment: post-procedure vital signs reviewed and stable Respiratory status: spontaneous breathing, nonlabored ventilation, respiratory function stable and patient connected to nasal cannula oxygen Cardiovascular status: blood pressure returned to baseline and stable Postop Assessment: no apparent nausea or vomiting Anesthetic complications: no     Last Vitals:  Vitals:   06/29/19 1008 06/29/19 1018  BP: 129/81 (!) 145/79  Pulse: 75 73  Resp: (!) 22 18  Temp:    SpO2: 100% 100%    Last Pain:  Vitals:   06/29/19 1018  TempSrc:   PainSc: 0-No pain                 Precious Haws Piscitello

## 2019-06-29 NOTE — Transfer of Care (Signed)
Immediate Anesthesia Transfer of Care Note  Patient: Wanda Hudson  Procedure(s) Performed: COLONOSCOPY WITH PROPOFOL (N/A )  Patient Location: PACU  Anesthesia Type:General  Level of Consciousness: sedated  Airway & Oxygen Therapy: Patient Spontanous Breathing and Patient connected to nasal cannula oxygen  Post-op Assessment: Report given to RN and Post -op Vital signs reviewed and stable  Post vital signs: Reviewed and stable  Last Vitals:  Vitals Value Taken Time  BP 116/75 06/29/19 0958  Temp 36.4 C 06/29/19 0958  Pulse 89 06/29/19 0958  Resp 23 06/29/19 0958  SpO2 100 % 06/29/19 0958    Last Pain:  Vitals:   06/29/19 0913  TempSrc: Temporal         Complications: No apparent anesthesia complications

## 2019-06-29 NOTE — Anesthesia Preprocedure Evaluation (Signed)
Anesthesia Evaluation  Patient identified by MRN, date of birth, ID band Patient awake    Reviewed: Allergy & Precautions, H&P , NPO status , Patient's Chart, lab work & pertinent test results  History of Anesthesia Complications Negative for: history of anesthetic complications  Airway Mallampati: II  TM Distance: >3 FB Neck ROM: full    Dental  (+) Upper Dentures, Lower Dentures, Poor Dentition   Pulmonary neg shortness of breath, Current Smoker,           Cardiovascular Exercise Tolerance: Good hypertension, (-) angina(-) Past MI and (-) DOE      Neuro/Psych PSYCHIATRIC DISORDERS  Neuromuscular disease    GI/Hepatic negative GI ROS, Neg liver ROS, neg GERD  ,  Endo/Other  negative endocrine ROS  Renal/GU negative Renal ROS  negative genitourinary   Musculoskeletal   Abdominal   Peds  Hematology negative hematology ROS (+)   Anesthesia Other Findings Past Medical History: No date: Anxiety 2017: Breast cyst, left No date: Depression No date: Hypertension No date: Insomnia No date: Osteopenia No date: Peripheral neuropathy  Past Surgical History: 2017: BREAST CYST ASPIRATION; Left     Comment:  [facility] No date: 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 No date: DG SALIVARY GLANDS ([place] HX) No date: TUMOR REMOVAL     Comment:  ears  BMI    Body Mass Index: 21.31 kg/m      Reproductive/Obstetrics negative OB ROS                             Anesthesia Physical Anesthesia Plan  ASA: III  Anesthesia Plan: General   Post-op Pain Management:    Induction: Intravenous  PONV Risk Score and Plan: Propofol infusion and TIVA  Airway Management Planned: Natural Airway and Nasal Cannula  Additional Equipment:   Intra-op Plan:   Post-operative Plan:   Informed Consent: I have reviewed the patients History and Physical, chart, labs and discussed the procedure including the  risks, benefits and alternatives for the proposed anesthesia with the patient or authorized representative who has indicated his/her understanding and acceptance.     Dental Advisory Given  Plan Discussed with: Anesthesiologist, CRNA and Surgeon  Anesthesia Plan Comments: (Patient consented for risks of anesthesia including but not limited to:  - adverse reactions to medications - risk of intubation if required - damage to teeth, lips or other oral mucosa - sore throat or hoarseness - Damage to heart, brain, lungs or loss of life  Patient voiced understanding.)        Anesthesia Quick Evaluation

## 2019-06-29 NOTE — H&P (Signed)
Lucilla Lame, MD [address] [place], [place] [address] Phone: [phone] Fax : [phone]  Primary Care Physician:  Venita Lick, NP Primary Gastroenterologist:  Dr. Allen Norris  Pre-Procedure History & Physical: HPI:  Wanda Hudson is a 48 y.o. female is here for a screening colonoscopy.   Past Medical History:  Diagnosis Date  . Anxiety   . Breast cyst, left 2017  . Depression   . Hypertension   . Insomnia   . Osteopenia   . Peripheral neuropathy     Past Surgical History:  Procedure Laterality Date  . BREAST CYST ASPIRATION Left 2017   [facility]  . 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 DG SALIVARY GLANDS ([place] [facility]X)    . TUMOR REMOVAL     ears    Prior to Admission medications   Medication Sig Start Date End Date Taking? Authorizing Provider  Alpha-Lipoic Acid 100 MG TABS Take 1 tablet by mouth daily.   Yes [provider]  amLODipine (NORVASC) 5 MG tablet TAKE 1 TABLET BY MOUTH EVERY DAY 06/18/19  Yes Cannady, Jolene T, NP  Aspirin-Salicylamide-Caffeine (BC FAST PAIN RELIEF) 650-195-33.3 MG PACK Take by mouth as needed.   Yes [provider]  busPIRone (BUSPAR) 5 MG tablet Take 1 tablet (5 mg total) by mouth 2 (two) times daily as needed. 06/22/19  Yes Cannady, Jolene T, NP  sertraline (ZOLOFT) 25 MG tablet Take 1 tablet (25 mg total) by mouth daily. 06/22/19  Yes Cannady, Jolene T, NP  sulfacetamide (BLEPH-10) 10 % ophthalmic solution APPLY 4 DROPS TO DRAINING EAR TWICE DAILY FOR 2 WEEKS 11/08/18  Yes [provider]  Vitamin D, Cholecalciferol, 1000 UNITS TABS Take 1,000 Units by mouth daily.   Yes [provider]  Evening Primrose Oil 500 MG CAPS Take 500 mg by mouth daily.    [provider]  polyethylene glycol-electrolytes (NULYTELY/GOLYTELY) 420 g solution Drink one 8 oz glass every 20 mins until entire container is finished starting at 5:00pm 06/28/19   Lucilla Lame, MD    Allergies as of  06/25/2019 - Review Complete 06/22/2019  Allergen Reaction Noted  . Codeine Rash 06/12/2013    Family History  Problem Relation Age of Onset  . Diabetes Mother   . Hypertension Mother   . Stroke Mother   . Diabetes Father   . Hypertension Father   . Lung cancer Father   . Heart disease Maternal Grandfather        MI  . Heart disease Paternal Grandfather        MI  . Hypertension Brother   . Aneurysm Maternal Grandmother   . Breast cancer Neg Hx     Social History   Socioeconomic History  . Marital status: Single    Spouse name: Not on file  . Number of children: Not on file  . Years of education: Not on file  . Highest education level: Not on file  Occupational History  . Not on file  Social Needs  . Financial resource strain: Not on file  . Food insecurity    Worry: Not on file    Inability: Not on file  . Transportation needs    Medical: Not on file    Non-medical: Not on file  Tobacco Use  . Smoking status: Current Some Day Smoker    Packs/day: 0.50    Years: 21.00    Pack years: 10.50    Types: Cigarettes  .  Smokeless tobacco: Never Used  Substance and Sexual Activity  . Alcohol use: No    Comment: occasionally  . Drug use: No  . Sexual activity: Yes    Birth control/protection: Injection  Lifestyle  . Physical activity    Days per week: Not on file    Minutes per session: Not on file  . Stress: Not on file  Relationships  . Social Herbalist on phone: Not on file    Gets together: Not on file    Attends religious service: Not on file    Active member of club or organization: Not on file    Attends meetings of clubs or organizations: Not on file    Relationship status: Not on file  . Intimate partner violence    Fear of current or ex partner: Not on file    Emotionally abused: Not on file    Physically abused: Not on file    Forced sexual activity: Not on file  Other Topics Concern  . Not on file  Social History Narrative  . Not  on file    Review of Systems: See HPI, otherwise negative ROS  Physical Exam: BP (!) 145/86   Pulse 75   Temp 97.6 F (36.4 C) (Temporal)   Resp 18   Ht 5\' 6"  (1.676 m)   Wt 59.9 kg   SpO2 100%   BMI 21.31 kg/m  General:   Alert,  pleasant and cooperative in NAD Head:  Normocephalic and atraumatic. Neck:  Supple; no masses or thyromegaly. Lungs:  Clear throughout to auscultation.    Heart:  Regular rate and rhythm. Abdomen:  Soft, nontender and nondistended. Normal bowel sounds, without guarding, and without rebound.   Neurologic:  Alert and  oriented x4;  grossly normal neurologically.  Impression/Plan: Wanda Hudson is now here to undergo a screening colonoscopy.  Risks, benefits, and alternatives regarding colonoscopy have been reviewed with the patient.  Questions have been answered.  All parties agreeable.

## 2019-06-29 NOTE — Anesthesia Post-op Follow-up Note (Signed)
Anesthesia QCDR form completed.        

## 2019-06-29 NOTE — Op Note (Signed)
[facility] Patient Name: Wanda Hudson Procedure Date: 06/29/2019 8:56 AM MRN: [id number] Account #: [id number] Date of Birth: [birth year] Admit Type: Outpatient Age: 48 Room: [facility] ENDO ROOM 4 Gender: Female Note Status: Finalized Procedure:             Colonoscopy Indications:           Screening for colorectal malignant neoplasm Providers:             Lucilla Lame MD, MD Medicines:             Propofol per Anesthesia Complications:         No immediate complications. Procedure:             Pre-Anesthesia Assessment:                        - Prior to the procedure, a History and Physical was                         performed, and patient medications and allergies were                         reviewed. The patient's tolerance of previous                         anesthesia was also reviewed. The risks and benefits                         of the procedure and the sedation options and risks                         were discussed with the patient. All questions were                         answered, and informed consent was obtained. Prior                         Anticoagulants: The patient has taken no previous                         anticoagulant or antiplatelet agents. ASA Grade                         Assessment: II - A patient with mild systemic disease.                         After reviewing the risks and benefits, the patient                         was deemed in satisfactory condition to undergo the                         procedure.                        After obtaining informed consent, the colonoscope was                         passed under direct vision. Throughout the procedure,  the patient's blood pressure, pulse, and oxygen                         saturations were monitored continuously. The                         Colonoscope was introduced through the anus and                         advanced to the the  cecum, identified by appendiceal                         orifice and ileocecal valve. The colonoscopy was                         performed without difficulty. The patient tolerated                         the procedure well. The quality of the bowel                         preparation was excellent. Findings:      The perianal and digital rectal examinations were normal.      A 4 mm polyp was found in the ascending colon. The polyp was sessile.       The polyp was removed with a cold snare. Resection and retrieval were       complete.      Two sessile polyps were found in the descending colon. The polyps were 3       to 4 mm in size. These polyps were removed with a cold snare. Resection       and retrieval were complete.      Non-bleeding internal hemorrhoids were found during retroflexion. The       hemorrhoids were Grade I (internal hemorrhoids that do not prolapse). Impression:            - One 4 mm polyp in the ascending colon, removed with                         a cold snare. Resected and retrieved.                        - Two 3 to 4 mm polyps in the descending colon,                         removed with a cold snare. Resected and retrieved.                        - Non-bleeding internal hemorrhoids. Recommendation:        - Discharge patient to home.                        - Resume previous diet.                        - Continue present medications.                        - Await pathology results.                        -  Repeat colonoscopy in 5 years if polyp adenoma and                         10 years if hyperplastic Procedure Code(s):     --- Professional ---                        [phone], Colonoscopy, flexible; with removal of                         tumor(s), polyp(s), or other lesion(s) by snare                         technique Diagnosis Code(s):     --- Professional ---                        Z12.11, Encounter for screening for malignant neoplasm                          of colon                        K63.5, Polyp of colon CPT copyright 2019 American Medical Association. All rights reserved. The codes documented in this report are preliminary and upon coder review may  be revised to meet current compliance requirements. Lucilla Lame MD, MD 06/29/2019 9:55:53 AM This report has been signed electronically. Number of Addenda: 0 Note Initiated On: 06/29/2019 8:56 AM Scope Withdrawal Time: 0 hours 10 minutes 1 second  Total Procedure Duration: 0 hours 13 minutes 12 seconds  Estimated Blood Loss:  Estimated blood loss: none.      [facility]

## 2019-07-02 ENCOUNTER — Ambulatory Visit (INDEPENDENT_AMBULATORY_CARE_PROVIDER_SITE_OTHER): Payer: BC Managed Care – PPO | Admitting: Nurse Practitioner

## 2019-07-02 ENCOUNTER — Encounter: Payer: Self-pay | Admitting: Nurse Practitioner

## 2019-07-02 ENCOUNTER — Other Ambulatory Visit: Payer: Self-pay

## 2019-07-02 ENCOUNTER — Other Ambulatory Visit (HOSPITAL_COMMUNITY)
Admission: RE | Admit: 2019-07-02 | Discharge: 2019-07-02 | Disposition: A | Discharge status: Home / Self Care | Payer: BC Managed Care – PPO | Source: Ambulatory Visit | Attending: Nurse Practitioner | Admitting: Nurse Practitioner

## 2019-07-02 VITALS — BP 126/80 | HR 88 | Temp 98.1°F | Wt 125.0 lb

## 2019-07-02 DIAGNOSIS — F411 Generalized anxiety disorder: Secondary | ICD-10-CM | POA: Diagnosis not present

## 2019-07-02 DIAGNOSIS — Z8742 Personal history of other diseases of the female genital tract: Secondary | ICD-10-CM | POA: Diagnosis not present

## 2019-07-02 LAB — SURGICAL PATHOLOGY

## 2019-07-02 MED ORDER — SERTRALINE HCL 25 MG PO TABS: 25.0000 mg | ORAL_TABLET | Freq: Every day | ORAL | 3 refills | Status: DC

## 2019-07-02 NOTE — Assessment & Plan Note (Signed)
Repeat pap performed and sent.

## 2019-07-02 NOTE — Progress Notes (Signed)
BP 126/80   Pulse 88   Temp 98.1 F (36.7 C) (Oral)   Wt 125 lb (56.7 kg)   SpO2 95%   BMI 20.18 kg/m    Subjective:    Patient ID: Wanda Hudson, female    DOB: Mar 29, 1971, 48 y.o.   MRN: [id number]  HPI: Wanda Hudson is a 48 y.o. female  Chief Complaint  Patient presents with  . Papsmear   PAP SMEAR: Here for pap smear, had performed initially 06/22/2019 with negative HPV but unsatisfactory for cellular assessment.  Here today for repeat.  ANXIETY/STRESS Started Zoloft last visit and reports 100% improvement and ability to focus.   Duration:controlled Anxious mood: no  Excessive worrying: no Irritability: no  Sweating: no Nausea: no Palpitations:no Hyperventilation: no Panic attacks: no Agoraphobia: no  Obscessions/compulsions: no Depressed mood: no Depression screen [facility] 2/9 07/02/2019 06/22/2019 06/16/2018 05/17/2017 05/14/2016  Decreased Interest 0 1 0 0 0  Down, Depressed, Hopeless 0 2 0 0 0  PHQ - 2 Score 0 3 0 0 0  Altered sleeping 0 2 0 0 1  Tired, decreased energy 0 1 0 0 0  Change in appetite 0 0 0 0 1  Feeling bad or failure about yourself  0 0 0 0 0  Trouble concentrating 0 0 0 0 0  Moving slowly or fidgety/restless 0 0 0 0 0  Suicidal thoughts 0 0 0 0 0  PHQ-9 Score 0 6 0 0 2  Difficult doing work/chores Not difficult at all Somewhat difficult - - -   Anhedonia: no Weight changes: no Insomnia: none Hypersomnia: no Fatigue/loss of energy: no Feelings of worthlessness: no Feelings of guilt: no Impaired concentration/indecisiveness: no Suicidal ideations: no  Crying spells: no Recent Stressors/Life Changes: no   Relationship problems: no   Family stress: no     Financial stress: no    Job stress: no    Recent death/loss: no  GAD 7 : Generalized Anxiety Score 07/02/2019 06/22/2019  Nervous, Anxious, on Edge 0 2  Control/stop worrying 0 2  Worry too much - different things 0 2  Trouble relaxing 0 1  Restless 0 2  Easily annoyed or  irritable 0 2  Afraid - awful might happen 0 2  Total GAD 7 Score 0 13  Anxiety Difficulty Not difficult at all Somewhat difficult    Relevant past medical, surgical, family and social history reviewed and updated as indicated. Interim medical history since our last visit reviewed. Allergies and medications reviewed and updated.  Review of Systems  Constitutional: Negative for activity change, appetite change, diaphoresis, fatigue and fever.  Respiratory: Negative for cough, chest tightness and shortness of breath.   Cardiovascular: Negative for chest pain, palpitations and leg swelling.  Gastrointestinal: Negative for abdominal distention, abdominal pain, constipation, diarrhea, nausea and vomiting.  Endocrine: Negative for cold intolerance, heat intolerance, polydipsia, polyphagia and polyuria.  Neurological: Negative for dizziness, syncope, weakness, light-headedness, numbness and headaches.  Psychiatric/Behavioral: Negative.     Per HPI unless specifically indicated above     Objective:    BP 126/80   Pulse 88   Temp 98.1 F (36.7 C) (Oral)   Wt 125 lb (56.7 kg)   SpO2 95%   BMI 20.18 kg/m   Wt Readings from Last 3 Encounters:  07/02/19 125 lb (56.7 kg)  06/29/19 132 lb (59.9 kg)  06/22/19 126 lb (57.2 kg)    Physical Exam Vitals signs and nursing note reviewed.  Constitutional:  General: She is awake. She is not in acute distress.    Appearance: She is well-developed. She is not ill-appearing.  HENT:     Head: Normocephalic.     Right Ear: Hearing normal.     Left Ear: Hearing normal.  Eyes:     General: Lids are normal.        Right eye: No discharge.        Left eye: No discharge.     Conjunctiva/sclera: Conjunctivae normal.     Pupils: Pupils are equal, round, and reactive to light.  Neck:     Musculoskeletal: Normal range of motion and neck supple.     Vascular: No carotid bruit.  Cardiovascular:     Rate and Rhythm: Normal rate and regular rhythm.      Heart sounds: Normal heart sounds. No murmur. No gallop.   Pulmonary:     Effort: Pulmonary effort is normal. No accessory muscle usage or respiratory distress.     Breath sounds: Normal breath sounds.  Abdominal:     General: Bowel sounds are normal.     Palpations: Abdomen is soft.  Genitourinary:    Cervix: Normal.     Comments: Cervix anterior visualized immediately upon speculum placement. Musculoskeletal:     Right lower leg: No edema.     Left lower leg: No edema.  Skin:    General: Skin is warm and dry.  Neurological:     Mental Status: She is alert and oriented to person, place, and time.  Psychiatric:        Attention and Perception: Attention normal.        Mood and Affect: Mood normal.        Behavior: Behavior normal. Behavior is cooperative.        Thought Content: Thought content normal.        Judgment: Judgment normal.     Results for orders placed or performed during the hospital encounter of 06/29/19  Pregnancy, urine POC  Result Value Ref Range   Preg Test, Ur NEGATIVE NEGATIVE      Assessment & Plan:   Problem List Items Addressed This Visit      Other   History of abnormal cervical Pap smear - Primary    Repeat pap performed and sent.      Relevant Orders   Cytology - PAP   Generalized anxiety disorder    Improved with Sertraline.  Continue current medication regimen and adjust as needed.  Refills sent.      Relevant Medications   sertraline (ZOLOFT) 25 MG tablet       Follow up plan: Return in about 6 months (around 12/30/2019) for Mood and HTN.

## 2019-07-02 NOTE — Patient Instructions (Signed)

## 2019-07-02 NOTE — Assessment & Plan Note (Signed)
Improved with Sertraline.  Continue current medication regimen and adjust as needed.  Refills sent.

## 2019-07-03 ENCOUNTER — Encounter: Payer: Self-pay | Admitting: Gastroenterology

## 2019-07-05 LAB — CYTOLOGY - PAP
Comment: NEGATIVE
Diagnosis: NEGATIVE
High risk HPV: NEGATIVE

## 2019-07-10 ENCOUNTER — Other Ambulatory Visit: Payer: Self-pay

## 2019-07-10 ENCOUNTER — Other Ambulatory Visit: Payer: Self-pay | Admitting: Nurse Practitioner

## 2019-07-10 ENCOUNTER — Ambulatory Visit (INDEPENDENT_AMBULATORY_CARE_PROVIDER_SITE_OTHER): Payer: BC Managed Care – PPO

## 2019-07-10 DIAGNOSIS — Z3042 Encounter for surveillance of injectable contraceptive: Secondary | ICD-10-CM

## 2019-07-10 MED ORDER — MEDROXYPROGESTERONE ACETATE 150 MG/ML IM SUSP
150.0000 mg | Freq: Once | INTRAMUSCULAR | Status: AC
  Administered 2019-12-14: 150 mg via INTRAMUSCULAR

## 2019-07-10 MED ORDER — MEDROXYPROGESTERONE ACETATE 150 MG/ML IM SUSP
150.0000 mg | Freq: Once | INTRAMUSCULAR | Status: AC
  Administered 2019-09-26: 150 mg via INTRAMUSCULAR

## 2019-07-10 MED ORDER — MEDROXYPROGESTERONE ACETATE 150 MG/ML IM SUSP
150.0000 mg | Freq: Once | INTRAMUSCULAR | Status: AC
  Administered 2019-07-10: 150 mg via INTRAMUSCULAR

## 2019-07-10 NOTE — Progress Notes (Signed)
met 

## 2019-07-20 ENCOUNTER — Ambulatory Visit: Payer: BC Managed Care – PPO | Admitting: Nurse Practitioner

## 2019-09-25 ENCOUNTER — Ambulatory Visit: Payer: BC Managed Care – PPO

## 2019-09-26 ENCOUNTER — Ambulatory Visit (INDEPENDENT_AMBULATORY_CARE_PROVIDER_SITE_OTHER): Payer: BC Managed Care – PPO

## 2019-09-26 ENCOUNTER — Other Ambulatory Visit: Payer: Self-pay

## 2019-09-26 DIAGNOSIS — Z3042 Encounter for surveillance of injectable contraceptive: Secondary | ICD-10-CM | POA: Diagnosis not present

## 2019-10-17 ENCOUNTER — Other Ambulatory Visit: Payer: Self-pay | Admitting: Nurse Practitioner

## 2019-10-17 NOTE — Telephone Encounter (Signed)
Requested Prescriptions  Pending Prescriptions Disp Refills  . busPIRone (BUSPAR) 5 MG tablet [Pharmacy Med Name: BUSPIRONE HCL 5 MG TABLET] 60 tablet 3    Sig: TAKE 1 TABLET BY MOUTH 2 TIMES DAILY AS NEEDED.     Psychiatry: Anxiolytics/Hypnotics - Non-controlled Passed - 10/17/2019  1:01 AM      Passed - Valid encounter within last 6 months    Recent Outpatient Visits          3 months ago History of abnormal cervical Pap smear   [place], Barbaraann Faster, NP   3 months ago Annual physical exam   [place], Barbaraann Faster, NP   7 months ago Vaginal discharge   [place], Henrine Screws T, NP   1 year ago Breast cyst, left   [place], Lilia Argue, [place]   1 year ago Encounter for surveillance of injectable contraceptive   [place], Barbaraann Faster, NP      Future Appointments            In 2 months Cannady, Barbaraann Faster, NP [employer] [facility], [place]   In 8 months [place], Barbaraann Faster, NP [employer] [facility], [place]

## 2019-12-12 ENCOUNTER — Ambulatory Visit: Payer: BC Managed Care – PPO

## 2019-12-14 ENCOUNTER — Ambulatory Visit (INDEPENDENT_AMBULATORY_CARE_PROVIDER_SITE_OTHER): Payer: BC Managed Care – PPO

## 2019-12-14 ENCOUNTER — Other Ambulatory Visit: Payer: Self-pay

## 2019-12-14 DIAGNOSIS — Z3042 Encounter for surveillance of injectable contraceptive: Secondary | ICD-10-CM | POA: Diagnosis not present

## 2019-12-17 ENCOUNTER — Other Ambulatory Visit: Payer: Self-pay | Admitting: Nurse Practitioner

## 2019-12-31 ENCOUNTER — Telehealth (INDEPENDENT_AMBULATORY_CARE_PROVIDER_SITE_OTHER): Payer: BC Managed Care – PPO | Admitting: Nurse Practitioner

## 2019-12-31 ENCOUNTER — Encounter: Payer: Self-pay | Admitting: Nurse Practitioner

## 2019-12-31 VITALS — BP 127/70 | Wt 127.0 lb

## 2019-12-31 DIAGNOSIS — I10 Essential (primary) hypertension: Secondary | ICD-10-CM | POA: Diagnosis not present

## 2019-12-31 DIAGNOSIS — R630 Anorexia: Secondary | ICD-10-CM | POA: Diagnosis not present

## 2019-12-31 DIAGNOSIS — F411 Generalized anxiety disorder: Secondary | ICD-10-CM

## 2019-12-31 NOTE — Patient Instructions (Signed)
DASH Eating Plan DASH stands for "Dietary Approaches to Stop Hypertension." The DASH eating plan is a healthy eating plan that has been shown to reduce high blood pressure (hypertension). It may also reduce your risk for type 2 diabetes, heart disease, and stroke. The DASH eating plan may also help with weight loss. What are tips for following this plan?  General guidelines  Avoid eating more than 2,300 mg (milligrams) of salt (sodium) a day. If you have hypertension, you may need to reduce your sodium intake to 1,500 mg a day.  Limit alcohol intake to no more than 1 drink a day for nonpregnant women and 2 drinks a day for men. One drink equals 12 oz of beer, 5 oz of wine, or 1 oz of hard liquor.  Work with your health care provider to maintain a healthy body weight or to lose weight. Ask what an ideal weight is for you.  Get at least 30 minutes of exercise that causes your heart to beat faster (aerobic exercise) most days of the week. Activities may include walking, swimming, or biking.  Work with your health care provider or diet and nutrition specialist (dietitian) to adjust your eating plan to your individual calorie needs. Reading food labels   Check food labels for the amount of sodium per serving. Choose foods with less than 5 percent of the Daily Value of sodium. Generally, foods with less than 300 mg of sodium per serving fit into this eating plan.  To find whole grains, look for the word "whole" as the first word in the ingredient list. Shopping  Buy products labeled as "low-sodium" or "no salt added."  Buy fresh foods. Avoid canned foods and premade or frozen meals. Cooking  Avoid adding salt when cooking. Use salt-free seasonings or herbs instead of table salt or sea salt. Check with your health care provider or pharmacist before using salt substitutes.  Do not fry foods. Cook foods using healthy methods such as baking, boiling, grilling, and broiling instead.  Cook with  heart-healthy oils, such as olive, canola, soybean, or sunflower oil. Meal planning  Eat a balanced diet that includes: ? 5 or more servings of fruits and vegetables each day. At each meal, try to fill half of your plate with fruits and vegetables. ? Up to 6-8 servings of whole grains each day. ? Less than 6 oz of lean meat, poultry, or fish each day. A 3-oz serving of meat is about the same size as a deck of cards. One egg equals 1 oz. ? 2 servings of low-fat dairy each day. ? A serving of nuts, seeds, or beans 5 times each week. ? Heart-healthy fats. Healthy fats called Omega-3 fatty acids are found in foods such as flaxseeds and coldwater fish, like sardines, salmon, and mackerel.  Limit how much you eat of the following: ? Canned or prepackaged foods. ? Food that is high in trans fat, such as fried foods. ? Food that is high in saturated fat, such as fatty meat. ? Sweets, desserts, sugary drinks, and other foods with added sugar. ? Full-fat dairy products.  Do not salt foods before eating.  Try to eat at least 2 vegetarian meals each week.  Eat more home-cooked food and less restaurant, buffet, and fast food.  When eating at a restaurant, ask that your food be prepared with less salt or no salt, if possible. What foods are recommended? The items listed may not be a complete list. Talk with your dietitian about   what dietary choices are best for you. Grains Whole-grain or whole-wheat bread. Whole-grain or whole-wheat pasta. Brown rice. Oatmeal. Quinoa. Bulgur. Whole-grain and low-sodium cereals. Pita bread. Low-fat, low-sodium crackers. Whole-wheat flour tortillas. Vegetables Fresh or frozen vegetables (raw, steamed, roasted, or grilled). Low-sodium or reduced-sodium tomato and vegetable juice. Low-sodium or reduced-sodium tomato sauce and tomato paste. Low-sodium or reduced-sodium canned vegetables. Fruits All fresh, dried, or frozen fruit. Canned fruit in natural juice (without  added sugar). Meat and other protein foods Skinless chicken or turkey. Ground chicken or turkey. Pork with fat trimmed off. Fish and seafood. Egg whites. Dried beans, peas, or lentils. Unsalted nuts, nut butters, and seeds. Unsalted canned beans. Lean cuts of beef with fat trimmed off. Low-sodium, lean deli meat. Dairy Low-fat (1%) or fat-free (skim) milk. Fat-free, low-fat, or reduced-fat cheeses. Nonfat, low-sodium ricotta or cottage cheese. Low-fat or nonfat yogurt. Low-fat, low-sodium cheese. Fats and oils Soft margarine without trans fats. Vegetable oil. Low-fat, reduced-fat, or light mayonnaise and salad dressings (reduced-sodium). Canola, safflower, olive, soybean, and sunflower oils. Avocado. Seasoning and other foods Herbs. Spices. Seasoning mixes without salt. Unsalted popcorn and pretzels. Fat-free sweets. What foods are not recommended? The items listed may not be a complete list. Talk with your dietitian about what dietary choices are best for you. Grains Baked goods made with fat, such as croissants, muffins, or some breads. Dry pasta or rice meal packs. Vegetables Creamed or fried vegetables. Vegetables in a cheese sauce. Regular canned vegetables (not low-sodium or reduced-sodium). Regular canned tomato sauce and paste (not low-sodium or reduced-sodium). Regular tomato and vegetable juice (not low-sodium or reduced-sodium). Pickles. Olives. Fruits Canned fruit in a light or heavy syrup. Fried fruit. Fruit in cream or butter sauce. Meat and other protein foods Fatty cuts of meat. Ribs. Fried meat. Bacon. Sausage. Bologna and other processed lunch meats. Salami. Fatback. Hotdogs. Bratwurst. Salted nuts and seeds. Canned beans with added salt. Canned or smoked fish. Whole eggs or egg yolks. Chicken or turkey with skin. Dairy Whole or 2% milk, cream, and half-and-half. Whole or full-fat cream cheese. Whole-fat or sweetened yogurt. Full-fat cheese. Nondairy creamers. Whipped toppings.  Processed cheese and cheese spreads. Fats and oils Butter. Stick margarine. Lard. Shortening. Ghee. Bacon fat. Tropical oils, such as coconut, palm kernel, or palm oil. Seasoning and other foods Salted popcorn and pretzels. Onion salt, garlic salt, seasoned salt, table salt, and sea salt. Worcestershire sauce. Tartar sauce. Barbecue sauce. Teriyaki sauce. Soy sauce, including reduced-sodium. Steak sauce. Canned and packaged gravies. Fish sauce. Oyster sauce. Cocktail sauce. Horseradish that you find on the shelf. Ketchup. Mustard. Meat flavorings and tenderizers. Bouillon cubes. Hot sauce and Tabasco sauce. Premade or packaged marinades. Premade or packaged taco seasonings. Relishes. Regular salad dressings. Where to find more information:  National Heart, Lung, and Blood Institute: www.nhlbi.nih.gov  American Heart Association: www.heart.org Summary  The DASH eating plan is a healthy eating plan that has been shown to reduce high blood pressure (hypertension). It may also reduce your risk for type 2 diabetes, heart disease, and stroke.  With the DASH eating plan, you should limit salt (sodium) intake to 2,300 mg a day. If you have hypertension, you may need to reduce your sodium intake to 1,500 mg a day.  When on the DASH eating plan, aim to eat more fresh fruits and vegetables, whole grains, lean proteins, low-fat dairy, and heart-healthy fats.  Work with your health care provider or diet and nutrition specialist (dietitian) to adjust your eating plan to your   individual calorie needs. This information is not intended to replace advice given to you by your health care provider. Make sure you discuss any questions you have with your health care provider. Document Revised: 07/15/2017 Document Reviewed: 07/26/2016 Elsevier Patient Education  2020 Elsevier Inc.  

## 2019-12-31 NOTE — Progress Notes (Signed)
BP 127/70   Wt 127 lb (57.6 kg)   BMI 20.50 kg/m    Subjective:    Patient ID: Wanda Hudson, female    DOB: 19-Jun-1971, 49 y.o.   MRN: [id number]  HPI: Wanda Hudson is a 49 y.o. female  Chief Complaint  Patient presents with  . Depression  . Hypertension    . This visit was completed via telephone due to the restrictions of the COVID-19 pandemic. All issues as above were discussed and addressed but no physical exam was performed. If it was felt that the patient should be evaluated in the office, they were directed there. The patient verbally consented to this visit. Patient was unable to complete an audio/visual visit due to Technical difficulties,Lack of internet. Due to the catastrophic nature of the COVID-19 pandemic, this visit was done through audio contact only. Attempted MyChart on phone and computer, patient could not hear provider. . Location of the patient: home . Location of the provider: work . Those involved with this call:  . Provider: Marnee Guarneri, DNP . CMA: Yvonna Alanis, CMA . Front Desk/Registration: Don Perking  . Time spent on call: 15 minutes on the phone discussing health concerns. 10 minutes total spent in review of patient's record and preparation of their chart.  . I verified patient identity using two factors (patient name and date of birth). Patient consents verbally to being seen via telemedicine visit today.    ANXIETY Continues on Buspar 5 MG QDAY (is ordered BID as needed, but is taking once a day) and Sertraline 25 MG.  Reports a mild decreased appetite over past 4 weeks, with no N&V, diarrhea, constipation, blood in stool.  Has lost about 8 pounds.  Last colonoscopy 06/29/2019.  Endorses increased fatigue.  Continues to smoke daily. Mood status: stable Satisfied with current treatment?: yes Symptom severity: mild  Duration of current treatment : chronic Side effects: no Medication compliance: good  compliance Psychotherapy/counseling: none Anxious mood: no Anhedonia: no Significant weight loss or gain: no Insomnia: none Fatigue: yes Feelings of worthlessness or guilt: no Impaired concentration/indecisiveness: no Suicidal ideations: no Hopelessness: no Crying spells: no Depression screen [facility] 2/9 12/31/2019 07/02/2019 06/22/2019 06/16/2018 05/17/2017  Decreased Interest 0 0 1 0 0  Down, Depressed, Hopeless 0 0 2 0 0  PHQ - 2 Score 0 0 3 0 0  Altered sleeping 0 0 2 0 0  Tired, decreased energy 0 0 1 0 0  Change in appetite 1 0 0 0 0  Feeling bad or failure about yourself  0 0 0 0 0  Trouble concentrating 0 0 0 0 0  Moving slowly or fidgety/restless 0 0 0 0 0  Suicidal thoughts 0 0 0 0 0  PHQ-9 Score 1 0 6 0 0  Difficult doing work/chores Not difficult at all Not difficult at all Somewhat difficult - -   HYPERTENSION Continues on Amlodipine 5 MG. Hypertension status: stable  Satisfied with current treatment? yes Duration of hypertension: chronic BP monitoring frequency:  a few times a week BP range: 120/70 range BP medication side effects:  no Medication compliance: good compliance Aspirin: no Recurrent headaches: no Visual changes: no Palpitations: no Dyspnea: no Chest pain: no Lower extremity edema: no Dizzy/lightheaded: no  Relevant past medical, surgical, family and social history reviewed and updated as indicated. Interim medical history since our last visit reviewed. Allergies and medications reviewed and updated.  Review of Systems  Constitutional: Positive for appetite change and fatigue. Negative  for activity change, diaphoresis and fever.  Respiratory: Negative for cough, chest tightness and shortness of breath.   Cardiovascular: Negative for chest pain, palpitations and leg swelling.  Gastrointestinal: Negative for abdominal distention, abdominal pain, constipation, diarrhea, nausea and vomiting.  Endocrine: Negative for cold intolerance, heat intolerance,  polydipsia, polyphagia and polyuria.  Neurological: Negative for dizziness, syncope, weakness, light-headedness, numbness and headaches.  Psychiatric/Behavioral: Negative.     Per HPI unless specifically indicated above     Objective:    BP 127/70   Wt 127 lb (57.6 kg)   BMI 20.50 kg/m   Wt Readings from Last 3 Encounters:  12/31/19 127 lb (57.6 kg)  07/02/19 125 lb (56.7 kg)  06/29/19 132 lb (59.9 kg)    Physical Exam   Unable to perform due to telephone visit only.  Results for orders placed or performed in visit on 07/02/19  Cytology - PAP  Result Value Ref Range   High risk HPV Negative    Adequacy      Satisfactory for evaluation; transformation zone component PRESENT.   Diagnosis      - Negative for intraepithelial lesion or malignancy (NILM)   Comment Normal Reference Range HPV - Negative       Assessment & Plan:   Problem List Items Addressed This Visit      Cardiovascular and Mediastinum   Benign hypertension    Chronic, stable with BP at goal.  Continue current regimen and adjust as needed.  Obtain labs outpatient to include TSH, CMP, CBC.      Relevant Orders   Comprehensive metabolic panel   TSH     Other   Generalized anxiety disorder - Primary    Ongoing, stable. Continue Buspar and Sertraline, refills as needed.  This regimen is offering benefit to mood.  Denies SI/HI.  Return in 4 weeks for f/u due to current weight loss would like to see in office.  Will obtain outpatient labs.        Decrease in appetite    New over past 4 weeks, will check CBC, TSH, BMP outpatient and plan to follow-up with her in office in 4 weeks for face to face visit to further evaluate.  She agrees with this plan of care.  To return sooner if any new or worsening symptoms.      Relevant Orders   Comprehensive metabolic panel   TSH   CBC with Differential/Platelet      I discussed the assessment and treatment plan with the patient. The patient was provided an  opportunity to ask questions and all were answered. The patient agreed with the plan and demonstrated an understanding of the instructions.   The patient was advised to call back or seek an in-person evaluation if the symptoms worsen or if the condition fails to improve as anticipated.   I provided 15+ minutes of time during this encounter.  Follow up plan: Return in about 4 weeks (around 01/28/2020) for Weight loss and decreased appetite.

## 2019-12-31 NOTE — Assessment & Plan Note (Addendum)
Chronic, stable with BP at goal.  Continue current regimen and adjust as needed.  Obtain labs outpatient to include TSH, CMP, CBC.

## 2019-12-31 NOTE — Assessment & Plan Note (Signed)
Ongoing, stable. Continue Buspar and Sertraline, refills as needed.  This regimen is offering benefit to mood.  Denies SI/HI.  Return in 4 weeks for f/u due to current weight loss would like to see in office.  Will obtain outpatient labs.

## 2019-12-31 NOTE — Assessment & Plan Note (Signed)
New over past 4 weeks, will check CBC, TSH, BMP outpatient and plan to follow-up with her in office in 4 weeks for face to face visit to further evaluate.  She agrees with this plan of care.  To return sooner if any new or worsening symptoms.

## 2020-01-02 ENCOUNTER — Other Ambulatory Visit: Payer: Self-pay

## 2020-01-02 ENCOUNTER — Other Ambulatory Visit: Payer: BC Managed Care – PPO

## 2020-01-02 DIAGNOSIS — R630 Anorexia: Secondary | ICD-10-CM | POA: Diagnosis not present

## 2020-01-02 DIAGNOSIS — I10 Essential (primary) hypertension: Secondary | ICD-10-CM | POA: Diagnosis not present

## 2020-01-03 LAB — CBC WITH DIFFERENTIAL/PLATELET
Basophils Absolute: 0.1 10*3/uL (ref 0.0–0.2)
Basos: 1 %
EOS (ABSOLUTE): 0.1 10*3/uL (ref 0.0–0.4)
Eos: 2 %
Hematocrit: 42.1 % (ref 34.0–46.6)
Hemoglobin: 13.5 g/dL (ref 11.1–15.9)
Immature Grans (Abs): 0 10*3/uL (ref 0.0–0.1)
Immature Granulocytes: 0 %
Lymphocytes Absolute: 2.9 10*3/uL (ref 0.7–3.1)
Lymphs: 41 %
MCH: 27.7 pg (ref 26.6–33.0)
MCHC: 32.1 g/dL (ref 31.5–35.7)
MCV: 86 fL (ref 79–97)
Monocytes Absolute: 0.7 10*3/uL (ref 0.1–0.9)
Monocytes: 10 %
Neutrophils Absolute: 3.4 10*3/uL (ref 1.4–7.0)
Neutrophils: 46 %
Platelets: 376 10*3/uL (ref 150–450)
RBC: 4.88 x10E6/uL (ref 3.77–5.28)
RDW: 13.1 % (ref 11.7–15.4)
WBC: 7.2 10*3/uL (ref 3.4–10.8)

## 2020-01-03 LAB — COMPREHENSIVE METABOLIC PANEL
ALT: 12 IU/L (ref 0–32)
AST: 17 IU/L (ref 0–40)
Albumin/Globulin Ratio: 1.3 (ref 1.2–2.2)
Albumin: 4.4 g/dL (ref 3.8–4.8)
Alkaline Phosphatase: 126 IU/L — ABNORMAL HIGH (ref 48–121)
BUN/Creatinine Ratio: 21 (ref 9–23)
BUN: 10 mg/dL (ref 6–24)
Bilirubin Total: <0.2 mg/dL (ref 0.0–1.2)
CO2: 19 mmol/L — ABNORMAL LOW (ref 20–29)
Calcium: 9.3 mg/dL (ref 8.7–10.2)
Chloride: 106 mmol/L (ref 96–106)
Creatinine, Ser: 0.47 mg/dL — ABNORMAL LOW (ref 0.57–1.00)
GFR calc Af Amer: 133 mL/min/{1.73_m2} (ref >59)
GFR calc non Af Amer: 116 mL/min/{1.73_m2} (ref >59)
Globulin, Total: 3.3 g/dL (ref 1.5–4.5)
Glucose: 74 mg/dL (ref 65–99)
Potassium: 3.8 mmol/L (ref 3.5–5.2)
Sodium: 139 mmol/L (ref 134–144)
Total Protein: 7.7 g/dL (ref 6.0–8.5)

## 2020-01-03 LAB — TSH: TSH: 0.47 u[IU]/mL (ref 0.450–4.500)

## 2020-01-03 NOTE — Progress Notes (Signed)
Please let Wanda Hudson know that labs have returned and overall they look good with no concerning findings.  I will see her back on June 16th and we will check her weight and delve a little further into her decrease in appetite, but overall these labs are reassuring.  Have a great day!!

## 2020-01-30 ENCOUNTER — Other Ambulatory Visit: Payer: Self-pay

## 2020-01-30 ENCOUNTER — Encounter: Payer: Self-pay | Admitting: Nurse Practitioner

## 2020-01-30 ENCOUNTER — Ambulatory Visit (INDEPENDENT_AMBULATORY_CARE_PROVIDER_SITE_OTHER): Payer: BC Managed Care – PPO | Admitting: Nurse Practitioner

## 2020-01-30 DIAGNOSIS — R630 Anorexia: Secondary | ICD-10-CM

## 2020-01-30 NOTE — Assessment & Plan Note (Signed)
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  Overall she is maintaining her weight in 120 range and no red flag symptoms.  Labs recently unremarkable.  Recommend she continue current diet regimen as she is getting full breakfast and dinner, may consider eating lighter snack at work or finding cooler area at workplace to eat in.  Have recommended she return to office if any weight loss, N&V, abdominal pain, or changes present.  At this time will plan on seeing her at physical in November.

## 2020-01-30 NOTE — Progress Notes (Signed)
BP 128/79   Pulse 80 Comment: apical  Temp 99.3 F (37.4 C) (Oral)   Wt 123 lb 6.4 oz (56 kg)   SpO2 99%   BMI 19.92 kg/m    Subjective:    Patient ID: Wanda Hudson, female    DOB: [birth year], 49 y.o.   MRN: [id number]  HPI: Wanda Hudson is a 49 y.o. female  Chief Complaint  Patient presents with  . Weight Check    4 week f/up   DECREASED APPETITE: Follow-up for report last visit of mild decreased appetite over past 8 weeks only at work, with no N&V, diarrhea, constipation, blood in stool. Last colonoscopy 06/29/2019.  Continues to smoke daily.  Recent labs CMP, TSH, CBC -- unremarkable, mild elevation in alk phos == 126.  Reports no appetite at work due to heat at workplace, works in [employer] ([place] setting).  April 2020 weight was 124 and today 123.  She reports eating breakfast, eats 1/2 lunch, and then eating all dinner.  She feels at work with heat in [place] feels she can not eat as much and heat makes her nauseous.   Fever: no Nausea: only when in heat and trying to eat Vomiting: no Weight loss: no Decreased appetite: only if outside din heat or in hot area Diarrhea: no Constipation: no Blood in stool: no Heartburn: no Jaundice: no Rash: no Dysuria/urinary frequency: no Hematuria: no History of sexually transmitted disease: no Recurrent NSAID use: no  Relevant past medical, surgical, family and social history reviewed and updated as indicated. Interim medical history since our last visit reviewed. Allergies and medications reviewed and updated.  Review of Systems  Constitutional: Positive for appetite change (only at work). Negative for activity change, diaphoresis, fatigue and fever.  Respiratory: Negative for cough, chest tightness and shortness of breath.   Cardiovascular: Negative for chest pain, palpitations and leg swelling.  Gastrointestinal: Negative for abdominal distention, abdominal pain, constipation, diarrhea, nausea and vomiting.    Neurological: Negative for dizziness, syncope, weakness, light-headedness, numbness and headaches.  Psychiatric/Behavioral: Negative.     Per HPI unless specifically indicated above     Objective:    BP 128/79   Pulse 80 Comment: apical  Temp 99.3 F (37.4 C) (Oral)   Wt 123 lb 6.4 oz (56 kg)   SpO2 99%   BMI 19.92 kg/m   Wt Readings from Last 3 Encounters:  01/30/20 123 lb 6.4 oz (56 kg)  12/31/19 127 lb (57.6 kg)  07/02/19 125 lb (56.7 kg)    Physical Exam Vitals and nursing note reviewed.  Constitutional:      General: She is awake. She is not in acute distress.    Appearance: She is well-developed and well-groomed. She is not ill-appearing.  HENT:     Head: Normocephalic.     Right Ear: Hearing normal.     Left Ear: Hearing normal.  Eyes:     General: Lids are normal.        Right eye: No discharge.        Left eye: No discharge.     Conjunctiva/sclera: Conjunctivae normal.     Pupils: Pupils are equal, round, and reactive to light.  Neck:     Thyroid: No thyromegaly.     Vascular: No carotid bruit.  Cardiovascular:     Rate and Rhythm: Normal rate and regular rhythm.     Heart sounds: Normal heart sounds. No murmur heard.  No gallop.   Pulmonary:  Effort: Pulmonary effort is normal. No accessory muscle usage or respiratory distress.     Breath sounds: Normal breath sounds.  Abdominal:     General: Bowel sounds are normal. There is no distension.     Palpations: Abdomen is soft. There is no hepatomegaly or splenomegaly.     Tenderness: There is no abdominal tenderness.  Musculoskeletal:     Cervical back: Normal range of motion and neck supple.     Right lower leg: No edema.     Left lower leg: No edema.  Skin:    General: Skin is warm and dry.  Neurological:     Mental Status: She is alert and oriented to person, place, and time.  Psychiatric:        Attention and Perception: Attention normal.        Mood and Affect: Mood normal.        Speech:  Speech normal.        Behavior: Behavior normal. Behavior is cooperative.        Thought Content: Thought content normal.     Results for orders placed or performed in visit on 12/31/19  Comprehensive metabolic panel  Result Value Ref Range   Glucose 74 65 - 99 mg/dL   BUN 10 6 - 24 mg/dL   Creatinine, Ser 0.47 (L) 0.57 - 1.00 mg/dL   GFR calc non Af Amer 116 >59 mL/min/1.73   GFR calc Af Amer 133 >59 mL/min/1.73   BUN/Creatinine Ratio 21 9 - 23   Sodium 139 134 - 144 mmol/L   Potassium 3.8 3.5 - 5.2 mmol/L   Chloride 106 96 - 106 mmol/L   CO2 19 (L) 20 - 29 mmol/L   Calcium 9.3 8.7 - 10.2 mg/dL   Total Protein 7.7 6.0 - 8.5 g/dL   Albumin 4.4 3.8 - 4.8 g/dL   Globulin, Total 3.3 1.5 - 4.5 g/dL   Albumin/Globulin Ratio 1.3 1.2 - 2.2   Bilirubin Total <0.2 0.0 - 1.2 mg/dL   Alkaline Phosphatase 126 (H) 48 - 121 IU/L   AST 17 0 - 40 IU/L   ALT 12 0 - 32 IU/L  TSH  Result Value Ref Range   TSH 0.470 0.450 - 4.500 uIU/mL  CBC with Differential/Platelet  Result Value Ref Range   WBC 7.2 3.4 - 10.8 x10E3/uL   RBC 4.88 3.77 - 5.28 x10E6/uL   Hemoglobin 13.5 11.1 - 15.9 g/dL   Hematocrit 42.1 34.0 - 46.6 %   MCV 86 79 - 97 fL   MCH 27.7 26.6 - 33.0 pg   MCHC 32.1 31 - 35 g/dL   RDW 13.1 11.7 - 15.4 %   Platelets 376 150 - 450 x10E3/uL   Neutrophils 46 Not Estab. %   Lymphs 41 Not Estab. %   Monocytes 10 Not Estab. %   Eos 2 Not Estab. %   Basos 1 Not Estab. %   Neutrophils Absolute 3.4 1 - 7 x10E3/uL   Lymphocytes Absolute 2.9 0 - 3 x10E3/uL   Monocytes Absolute 0.7 0 - 0 x10E3/uL   EOS (ABSOLUTE) 0.1 0.0 - 0.4 x10E3/uL   Basophils Absolute 0.1 0 - 0 x10E3/uL   Immature Granulocytes 0 Not Estab. 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  Overall she is maintaining her weight in 120 range and no red flag  symptoms.  Labs recently unremarkable.  Recommend she continue current diet regimen as she is getting full breakfast and dinner, may consider eating lighter snack at work or finding cooler area at workplace to eat in.  Have recommended she return to office if any weight loss, N&V, abdominal pain, or changes present.  At this time will plan on seeing her at physical in November.          Follow up plan: Return in about 5 months (around 07/01/2020) for Annual physical -- last on was 06/22/2019.

## 2020-01-30 NOTE — Patient Instructions (Signed)
Healthy Eating Following a healthy eating pattern may help you to achieve and maintain a healthy body weight, reduce the risk of chronic disease, and live a long and productive life. It is important to follow a healthy eating pattern at an appropriate calorie level for your body. Your nutritional needs should be met primarily through food by choosing a variety of nutrient-rich foods. What are tips for following this plan? Reading food labels  Read labels and choose the following: ? Reduced or low sodium. ? Juices with 100% fruit juice. ? Foods with low saturated fats and high polyunsaturated and monounsaturated fats. ? Foods with whole grains, such as whole wheat, cracked wheat, brown rice, and wild rice. ? Whole grains that are fortified with folic acid. This is recommended for women who are pregnant or who want to become pregnant.  Read labels and avoid the following: ? Foods with a lot of added sugars. These include foods that contain brown sugar, corn sweetener, corn syrup, dextrose, fructose, glucose, high-fructose corn syrup, honey, invert sugar, lactose, malt syrup, maltose, molasses, raw sugar, sucrose, trehalose, or turbinado sugar.  Do not eat more than the following amounts of added sugar per day:  6 teaspoons (25 g) for women.  9 teaspoons (38 g) for men. ? Foods that contain processed or refined starches and grains. ? Refined grain products, such as white flour, degermed cornmeal, white bread, and white rice. Shopping  Choose nutrient-rich snacks, such as vegetables, whole fruits, and nuts. Avoid high-calorie and high-sugar snacks, such as potato chips, fruit snacks, and candy.  Use oil-based dressings and spreads on foods instead of solid fats such as butter, stick margarine, or cream cheese.  Limit pre-made sauces, mixes, and "instant" products such as flavored rice, instant noodles, and ready-made pasta.  Try more plant-protein sources, such as tofu, tempeh, black beans,  edamame, lentils, nuts, and seeds.  Explore eating plans such as the Mediterranean diet or vegetarian diet. Cooking  Use oil to saut or stir-fry foods instead of solid fats such as butter, stick margarine, or lard.  Try baking, boiling, grilling, or broiling instead of frying.  Remove the fatty part of meats before cooking.  Steam vegetables in water or broth. Meal planning   At meals, imagine dividing your plate into fourths: ? One-half of your plate is fruits and vegetables. ? One-fourth of your plate is whole grains. ? One-fourth of your plate is protein, especially lean meats, poultry, eggs, tofu, beans, or nuts.  Include low-fat dairy as part of your daily diet. Lifestyle  Choose healthy options in all settings, including home, work, school, restaurants, or stores.  Prepare your food safely: ? Wash your hands after handling raw meats. ? Keep food preparation surfaces clean by regularly washing with hot, soapy water. ? Keep raw meats separate from ready-to-eat foods, such as fruits and vegetables. ? Cook seafood, meat, poultry, and eggs to the recommended internal temperature. ? Store foods at safe temperatures. In general:  Keep cold foods at 59F (4.4C) or below.  Keep hot foods at 159F (60C) or above.  Keep your freezer at [facility] (-17.8C) or below.  Foods are no longer safe to eat when they have been between the temperatures of 40-159F (4.4-60C) for more than 2 hours. What foods should I eat? Fruits Aim to eat 2 cup-equivalents of fresh, canned (in natural juice), or frozen fruits each day. Examples of 1 cup-equivalent of fruit include 1 small apple, 8 large strawberries, 1 cup canned fruit,  cup  dried fruit, or 1 cup 100% juice. Vegetables Aim to eat 2-3 cup-equivalents of fresh and frozen vegetables each day, including different varieties and colors. Examples of 1 cup-equivalent of vegetables include 2 medium carrots, 2 cups raw, leafy greens, 1 cup chopped  vegetable (raw or cooked), or 1 medium baked potato. Grains Aim to eat 6 ounce-equivalents of whole grains each day. Examples of 1 ounce-equivalent of grains include 1 slice of bread, 1 cup ready-to-eat cereal, 3 cups popcorn, or  cup cooked rice, pasta, or cereal. Meats and other proteins Aim to eat 5-6 ounce-equivalents of protein each day. Examples of 1 ounce-equivalent of protein include 1 egg, 1/2 cup nuts or seeds, or 1 tablespoon (16 g) peanut butter. A cut of meat or fish that is the size of a deck of cards is about 3-4 ounce-equivalents.  Of the protein you eat each week, try to have at least 8 ounces come from seafood. This includes salmon, trout, herring, and anchovies. Dairy Aim to eat 3 cup-equivalents of fat-free or low-fat dairy each day. Examples of 1 cup-equivalent of dairy include 1 cup (240 mL) milk, 8 ounces (250 g) yogurt, 1 ounces (44 g) natural cheese, or 1 cup (240 mL) fortified soy milk. Fats and oils  Aim for about 5 teaspoons (21 g) per day. Choose monounsaturated fats, such as canola and olive oils, avocados, peanut butter, and most nuts, or polyunsaturated fats, such as sunflower, corn, and soybean oils, walnuts, pine nuts, sesame seeds, sunflower seeds, and flaxseed. Beverages  Aim for six 8-oz glasses of water per day. Limit coffee to three to five 8-oz cups per day.  Limit caffeinated beverages that have added calories, such as soda and energy drinks.  Limit alcohol intake to no more than 1 drink a day for nonpregnant women and 2 drinks a day for men. One drink equals 12 oz of beer (355 mL), 5 oz of wine (148 mL), or 1 oz of hard liquor (44 mL). Seasoning and other foods  Avoid adding excess amounts of salt to your foods. Try flavoring foods with herbs and spices instead of salt.  Avoid adding sugar to foods.  Try using oil-based dressings, sauces, and spreads instead of solid fats. This information is based on general [place] nutrition guidelines. For more  information, visit [employer].es. Exact amounts may vary based on your nutrition needs. Summary  A healthy eating plan may help you to maintain a healthy weight, reduce the risk of chronic diseases, and stay active throughout your life.  Plan your meals. Make sure you eat the right portions of a variety of nutrient-rich foods.  Try baking, boiling, grilling, or broiling instead of frying.  Choose healthy options in all settings, including home, work, school, restaurants, or stores. This information is not intended to replace advice given to you by your health care provider. Make sure you discuss any questions you have with your health care provider. Document Revised: 11/14/2017 Document Reviewed: 11/14/2017 Elsevier Patient Education  [place].

## 2020-02-12 DIAGNOSIS — M65311 Trigger thumb, right thumb: Secondary | ICD-10-CM | POA: Diagnosis not present

## 2020-02-12 DIAGNOSIS — G5601 Carpal tunnel syndrome, right upper limb: Secondary | ICD-10-CM | POA: Diagnosis not present

## 2020-02-12 DIAGNOSIS — M654 Radial styloid tenosynovitis [de Quervain]: Secondary | ICD-10-CM | POA: Diagnosis not present

## 2020-02-18 ENCOUNTER — Other Ambulatory Visit: Payer: Self-pay | Admitting: Nurse Practitioner

## 2020-03-03 ENCOUNTER — Ambulatory Visit (INDEPENDENT_AMBULATORY_CARE_PROVIDER_SITE_OTHER): Payer: BC Managed Care – PPO

## 2020-03-03 ENCOUNTER — Ambulatory Visit: Payer: BC Managed Care – PPO

## 2020-03-03 ENCOUNTER — Other Ambulatory Visit: Payer: Self-pay

## 2020-03-03 DIAGNOSIS — Z3042 Encounter for surveillance of injectable contraceptive: Secondary | ICD-10-CM | POA: Diagnosis not present

## 2020-03-03 MED ORDER — MEDROXYPROGESTERONE ACETATE 150 MG/ML IM SUSP
150.0000 mg | Freq: Once | INTRAMUSCULAR | Status: AC
  Administered 2020-03-03: 150 mg via INTRAMUSCULAR

## 2020-03-05 DIAGNOSIS — Z1231 Encounter for screening mammogram for malignant neoplasm of breast: Secondary | ICD-10-CM | POA: Diagnosis not present

## 2020-03-05 LAB — HM MAMMOGRAPHY

## 2020-05-19 ENCOUNTER — Ambulatory Visit (INDEPENDENT_AMBULATORY_CARE_PROVIDER_SITE_OTHER): Payer: BC Managed Care – PPO | Admitting: Nurse Practitioner

## 2020-05-19 ENCOUNTER — Other Ambulatory Visit: Payer: Self-pay

## 2020-05-19 DIAGNOSIS — Z3042 Encounter for surveillance of injectable contraceptive: Secondary | ICD-10-CM

## 2020-05-19 MED ORDER — MEDROXYPROGESTERONE ACETATE 150 MG/ML IM SUSP
150.0000 mg | Freq: Once | INTRAMUSCULAR | Status: AC
  Administered 2020-05-19: 150 mg via INTRAMUSCULAR

## 2020-06-11 ENCOUNTER — Other Ambulatory Visit: Payer: Self-pay | Admitting: Family Medicine

## 2020-06-11 NOTE — Telephone Encounter (Signed)
   Notes to clinic:  last filled by a different provider Review for refill   Requested Prescriptions  Pending Prescriptions Disp Refills   sertraline (ZOLOFT) 25 MG tablet [Pharmacy Med Name: SERTRALINE HCL 25 MG TABLET] 30 tablet 5    Sig: TAKE 1 TABLET BY MOUTH EVERY DAY      Psychiatry:  Antidepressants - SSRI Passed - 06/11/2020  1:34 AM      Passed - Valid encounter within last 6 months    Recent Outpatient Visits           4 months ago Decrease in appetite   [place], Spencer T, NP   5 months ago Generalized anxiety disorder   [place], [place] T, NP   11 months ago History of abnormal cervical Pap smear   [place], Barbaraann Faster, NP   11 months ago Annual physical exam   [place], Henrine Screws T, NP   1 year ago Vaginal discharge   [place], Barbaraann Faster, NP       Future Appointments             In 1 week Cannady, Barbaraann Faster, NP [facility]

## 2020-06-14 ENCOUNTER — Other Ambulatory Visit: Payer: Self-pay | Admitting: Nurse Practitioner

## 2020-06-14 NOTE — Telephone Encounter (Signed)
Requested medications are due for refill today yes  Requested medications are on the active medication list yes  Last refill 10/3  Last visit 10/2019  Future visit scheduled no  Notes to clinic Failed protocol of visit within 6 months and no upcoming visit scheduled.

## 2020-06-16 NOTE — Telephone Encounter (Signed)
Last routine visit in May 2021 and has follow up 06/23/20

## 2020-06-23 ENCOUNTER — Encounter: Payer: Self-pay | Admitting: Nurse Practitioner

## 2020-06-23 ENCOUNTER — Ambulatory Visit (INDEPENDENT_AMBULATORY_CARE_PROVIDER_SITE_OTHER): Payer: BC Managed Care – PPO | Admitting: Nurse Practitioner

## 2020-06-23 ENCOUNTER — Other Ambulatory Visit: Payer: Self-pay

## 2020-06-23 VITALS — BP 127/78 | HR 88 | Temp 98.8°F | Ht 65.35 in | Wt 124.6 lb

## 2020-06-23 DIAGNOSIS — Z1329 Encounter for screening for other suspected endocrine disorder: Secondary | ICD-10-CM

## 2020-06-23 DIAGNOSIS — Z1159 Encounter for screening for other viral diseases: Secondary | ICD-10-CM

## 2020-06-23 DIAGNOSIS — Z0000 Encounter for general adult medical examination without abnormal findings: Secondary | ICD-10-CM

## 2020-06-23 DIAGNOSIS — Z13220 Encounter for screening for lipoid disorders: Secondary | ICD-10-CM | POA: Diagnosis not present

## 2020-06-23 NOTE — Progress Notes (Signed)
BP 127/78   Pulse 88 Comment: apical  Temp 98.8 F (37.1 C) (Oral)   Ht 5' 5.35" (1.66 m)   Wt 124 lb 9.6 oz (56.5 kg)   SpO2 98%   BMI 20.51 kg/m    Subjective:    Patient ID: Wanda Hudson, female    DOB: 09-21-1970, 49 y.o.   MRN: [id number]  HPI: Wanda Hudson is a 49 y.o. female presenting on 06/23/2020 for comprehensive medical examination. Current medical complaints include:none  She currently lives with: significant other Menopausal Symptoms: none  Depression Screen done today and results listed below:  Depression screen [facility] 2/9 06/23/2020 12/31/2019 07/02/2019 06/22/2019 06/16/2018  Decreased Interest 1 0 0 1 0  Down, Depressed, Hopeless 2 0 0 2 0  PHQ - 2 Score 3 0 0 3 0  Altered sleeping 1 0 0 2 0  Tired, decreased energy 1 0 0 1 0  Change in appetite 0 1 0 0 0  Feeling bad or failure about yourself  0 0 0 0 0  Trouble concentrating 0 0 0 0 0  Moving slowly or fidgety/restless 0 0 0 0 0  Suicidal thoughts 0 0 0 0 0  PHQ-9 Score 5 1 0 6 0  Difficult doing work/chores Somewhat difficult Not difficult at all Not difficult at all Somewhat difficult -    The patient does not have a history of falls. I did not complete a risk assessment for falls. A plan of care for falls was not documented.   Past Medical History:  Past Medical History:  Diagnosis Date  . Anxiety   . Breast cyst, left 2017  . Depression   . Hypertension   . Insomnia   . Osteopenia   . Peripheral neuropathy     Surgical History:  Past Surgical History:  Procedure Laterality Date  . BREAST CYST ASPIRATION Left 2017   [facility]  . 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 COLONOSCOPY WITH PROPOFOL N/A 06/29/2019   Procedure: COLONOSCOPY WITH PROPOFOL;  Surgeon: Lucilla Lame, MD;  Location: [facility] ENDOSCOPY;  Service: Endoscopy;  Laterality: N/A;  . DG SALIVARY GLANDS ([place] HX)    . TUMOR REMOVAL     ears    Medications:  Current Outpatient Medications on File Prior to Visit    Medication Sig  . Alpha-Lipoic Acid 100 MG TABS Take 1 tablet by mouth daily.  Marland Kitchen amLODipine (NORVASC) 5 MG tablet TAKE 1 TABLET BY MOUTH EVERY DAY  . Aspirin-Salicylamide-Caffeine (BC FAST PAIN RELIEF) 650-195-33.3 MG PACK Take by mouth as needed.  . busPIRone (BUSPAR) 5 MG tablet TAKE 1 TABLET BY MOUTH 2 TIMES DAILY AS NEEDED.  Marland Kitchen Evening Primrose Oil 500 MG CAPS Take 500 mg by mouth daily.  . sertraline (ZOLOFT) 25 MG tablet TAKE 1 TABLET BY MOUTH EVERY DAY  . Vitamin D, Cholecalciferol, 1000 UNITS TABS Take 1,000 Units by mouth daily.  . Cyanocobalamin (B-12) 1000 MCG CAPS B12   No current facility-administered medications on file prior to visit.    Allergies:  Allergies  Allergen Reactions  . Codeine Rash    Social History:  Social History   Socioeconomic History  . Marital status: Single    Spouse name: Not on file  . Number of children: Not on file  . Years of education: Not on file  . Highest education level: Not on file  Occupational History  . Not on file  Tobacco Use  . Smoking status: Current  Some Day Smoker    Packs/day: 0.50    Years: 21.00    Pack years: 10.50    Types: Cigarettes  . Smokeless tobacco: Never Used  Vaping Use  . Vaping Use: Never used  Substance and Sexual Activity  . Alcohol use: No    Comment: occasionally  . Drug use: No  . Sexual activity: Yes    Birth control/protection: Injection  Other Topics Concern  . Not on file  Social History Narrative  . Not on file   Social Determinants of Health   Financial Resource Strain:   . Difficulty of Paying Living Expenses: Not on file  Food Insecurity:   . Worried About Charity fundraiser in the Last Year: Not on file  . Ran Out of Food in the Last Year: Not on file  Transportation Needs:   . Lack of Transportation (Medical): Not on file  . Lack of Transportation (Non-Medical): Not on file  Physical Activity:   . Days of Exercise per Week: Not on file  . Minutes of Exercise per  Session: Not on file  Stress:   . Feeling of Stress : Not on file  Social Connections:   . Frequency of Communication with Friends and Family: Not on file  . Frequency of Social Gatherings with Friends and Family: Not on file  . Attends Religious Services: Not on file  . Active Member of Clubs or Organizations: Not on file  . Attends Archivist Meetings: Not on file  . Marital Status: Not on file  Intimate Partner Violence:   . Fear of Current or Ex-Partner: Not on file  . Emotionally Abused: Not on file  . Physically Abused: Not on file  . Sexually Abused: Not on file   Social History   Tobacco Use  Smoking Status Current Some Day Smoker  . Packs/day: 0.50  . Years: 21.00  . Pack years: 10.50  . Types: Cigarettes  Smokeless Tobacco Never Used   Social History   Substance and Sexual Activity  Alcohol Use No   Comment: occasionally    Family History:  Family History  Problem Relation Age of Onset  . Diabetes Mother   . Hypertension Mother   . Stroke Mother   . Diabetes Father   . Hypertension Father   . Lung cancer Father   . Heart disease Maternal Grandfather        MI  . Heart disease Paternal Grandfather        MI  . Hypertension Brother   . Aneurysm Maternal Grandmother   . Breast cancer Neg Hx     Past medical history, surgical history, medications, allergies, family history and social history reviewed with patient today and changes made to appropriate areas of the chart.   Review of Systems -  negative All other ROS negative except what is listed above and in the HPI.      Objective:    BP 127/78   Pulse 88 Comment: apical  Temp 98.8 F (37.1 C) (Oral)   Ht 5' 5.35" (1.66 m)   Wt 124 lb 9.6 oz (56.5 kg)   SpO2 98%   BMI 20.51 kg/m   Wt Readings from Last 3 Encounters:  06/23/20 124 lb 9.6 oz (56.5 kg)  01/30/20 123 lb 6.4 oz (56 kg)  12/31/19 127 lb (57.6 kg)    Physical Exam Constitutional:      General: She is awake. She is  not in acute distress.  Appearance: She is well-developed. She is not ill-appearing.  HENT:     Head: Normocephalic and atraumatic.     Right Ear: Hearing, tympanic membrane, ear canal and external ear normal. No drainage.     Left Ear: Hearing, tympanic membrane, ear canal and external ear normal. No drainage.     Nose: Nose normal.     Right Sinus: No maxillary sinus tenderness or frontal sinus tenderness.     Left Sinus: No maxillary sinus tenderness or frontal sinus tenderness.     Mouth/Throat:     Mouth: Mucous membranes are moist.     Pharynx: Oropharynx is clear. Uvula midline. No pharyngeal swelling, oropharyngeal exudate or posterior oropharyngeal erythema.  Eyes:     General: Lids are normal.        Right eye: No discharge.        Left eye: No discharge.     Extraocular Movements: Extraocular movements intact.     Conjunctiva/sclera: Conjunctivae normal.     Pupils: Pupils are equal, round, and reactive to light.     Visual Fields: Right eye visual fields normal and left eye visual fields normal.  Neck:     Thyroid: No thyromegaly.     Vascular: No carotid bruit.     Trachea: Trachea normal.  Cardiovascular:     Rate and Rhythm: Normal rate and regular rhythm.     Heart sounds: Normal heart sounds. No murmur heard.  No gallop.   Pulmonary:     Effort: Pulmonary effort is normal. No accessory muscle usage or respiratory distress.     Breath sounds: Normal breath sounds.  Chest:     Breasts:        Right: Normal.        Left: Normal.  Abdominal:     General: Bowel sounds are normal.     Palpations: Abdomen is soft. There is no hepatomegaly or splenomegaly.     Tenderness: There is no abdominal tenderness.  Musculoskeletal:        General: Normal range of motion.     Cervical back: Normal range of motion and neck supple.     Right lower leg: No edema.     Left lower leg: No edema.  Lymphadenopathy:     Head:     Right side of head: No submental, submandibular,  tonsillar, preauricular or posterior auricular adenopathy.     Left side of head: No submental, submandibular, tonsillar, preauricular or posterior auricular adenopathy.     Cervical: No cervical adenopathy.     Upper Body:     Right upper body: No supraclavicular, axillary or pectoral adenopathy.     Left upper body: No supraclavicular, axillary or pectoral adenopathy.  Skin:    General: Skin is warm and dry.     Capillary Refill: Capillary refill takes less than 2 seconds.     Findings: No rash.  Neurological:     Mental Status: She is alert and oriented to person, place, and time.     Cranial Nerves: Cranial nerves are intact.     Gait: Gait is intact.     Deep Tendon Reflexes: Reflexes are normal and symmetric.     Reflex Scores:      Brachioradialis reflexes are 2+ on the right side and 2+ on the left side.      Patellar reflexes are 2+ on the right side and 2+ on the left side. Psychiatric:        Attention and Perception:  Attention normal.        Mood and Affect: Mood normal.        Speech: Speech normal.        Behavior: Behavior normal. Behavior is cooperative.        Thought Content: Thought content normal.        Judgment: Judgment normal.    Results for orders placed or performed in visit on 03/06/20  [facility] MAMMOGRAPHY  Result Value Ref Range   HM Mammogram 0-4 Bi-Rad 0-4 Bi-Rad, Self Reported Normal      Assessment & Plan:   Problem List Items Addressed This Visit    None    Visit Diagnoses    Encounter for annual physical exam    -  Primary   Annual labs today to include CBC, CMP, TSH, Lipid   Relevant Orders   CBC with Differential/Platelet   Comprehensive metabolic panel   Need for hepatitis C screening test       Hep C screening on labs today x 1, per guideline recommendations.   Relevant Orders   Hepatitis C antibody   Screening cholesterol level       Lipid panel on labs today.  Not fasting -- ate bacon sandwich.   Relevant Orders   Lipid Panel w/o  Chol/HDL Ratio   Thyroid disorder screening       Thyroid screen today.   Relevant Orders   TSH       Follow up plan: Return in about 6 months (around 12/21/2020) for MOOD, OSTEOPENIA, Vit D, B12 checks.   LABORATORY TESTING:  - Pap smear: up to date  IMMUNIZATIONS:   - Tdap: Tetanus vaccination status reviewed: last tetanus booster 4 years ago. - Influenza: Up to date - Pneumovax: Not applicable - Prevnar: Not applicable - HPV: Not applicable - Zostavax vaccine: Refused  SCREENING: -Mammogram: Up to date 03/05/20 - Colonoscopy: Up to date -- due next 06/28/2024 - Bone Density: Not applicable  -Hearing Test: Not applicable  -Spirometry: Not applicable   PATIENT COUNSELING:   Advised to take 1 mg of folate supplement per day if capable of pregnancy.   Sexuality: Discussed sexually transmitted diseases, partner selection, use of condoms, avoidance of unintended pregnancy  and contraceptive alternatives.   Advised to avoid cigarette smoking.  I discussed with the patient that most people either abstain from alcohol or drink within safe limits (<=14/week and <=4 drinks/occasion for males, <=7/weeks and <= 3 drinks/occasion for females) and that the risk for alcohol disorders and other health effects rises proportionally with the number of drinks per week and how often a drinker exceeds daily limits.  Discussed cessation/primary prevention of drug use and availability of treatment for abuse.   Diet: Encouraged to adjust caloric intake to maintain  or achieve ideal body weight, to reduce intake of dietary saturated fat and total fat, to limit sodium intake by avoiding high sodium foods and not adding table salt, and to maintain adequate dietary potassium and calcium preferably from fresh fruits, vegetables, and low-fat dairy products.    Stressed the importance of regular exercise  Injury prevention: Discussed safety belts, safety helmets, smoke detector, smoking near bedding or  upholstery.   Dental health: Discussed importance of regular tooth brushing, flossing, and dental visits.    NEXT PREVENTATIVE PHYSICAL DUE IN 1 YEAR. Return in about 6 months (around 12/21/2020) for MOOD, OSTEOPENIA, Vit D, B12 checks.

## 2020-06-23 NOTE — Patient Instructions (Signed)
Healthy Eating Following a healthy eating pattern may help you to achieve and maintain a healthy body weight, reduce the risk of chronic disease, and live a long and productive life. It is important to follow a healthy eating pattern at an appropriate calorie level for your body. Your nutritional needs should be met primarily through food by choosing a variety of nutrient-rich foods. What are tips for following this plan? Reading food labels  Read labels and choose the following: ? Reduced or low sodium. ? Juices with 100% fruit juice. ? Foods with low saturated fats and high polyunsaturated and monounsaturated fats. ? Foods with whole grains, such as whole wheat, cracked wheat, brown rice, and wild rice. ? Whole grains that are fortified with folic acid. This is recommended for women who are pregnant or who want to become pregnant.  Read labels and avoid the following: ? Foods with a lot of added sugars. These include foods that contain brown sugar, corn sweetener, corn syrup, dextrose, fructose, glucose, high-fructose corn syrup, honey, invert sugar, lactose, malt syrup, maltose, molasses, raw sugar, sucrose, trehalose, or turbinado sugar.  Do not eat more than the following amounts of added sugar per day:  6 teaspoons (25 g) for women.  9 teaspoons (38 g) for men. ? Foods that contain processed or refined starches and grains. ? Refined grain products, such as white flour, degermed cornmeal, white bread, and white rice. Shopping  Choose nutrient-rich snacks, such as vegetables, whole fruits, and nuts. Avoid high-calorie and high-sugar snacks, such as potato chips, fruit snacks, and candy.  Use oil-based dressings and spreads on foods instead of solid fats such as butter, stick margarine, or cream cheese.  Limit pre-made sauces, mixes, and "instant" products such as flavored rice, instant noodles, and ready-made pasta.  Try more plant-protein sources, such as tofu, tempeh, black beans,  edamame, lentils, nuts, and seeds.  Explore eating plans such as the Mediterranean diet or vegetarian diet. Cooking  Use oil to saut or stir-fry foods instead of solid fats such as butter, stick margarine, or lard.  Try baking, boiling, grilling, or broiling instead of frying.  Remove the fatty part of meats before cooking.  Steam vegetables in water or broth. Meal planning   At meals, imagine dividing your plate into fourths: ? One-half of your plate is fruits and vegetables. ? One-fourth of your plate is whole grains. ? One-fourth of your plate is protein, especially lean meats, poultry, eggs, tofu, beans, or nuts.  Include low-fat dairy as part of your daily diet. Lifestyle  Choose healthy options in all settings, including home, work, school, restaurants, or stores.  Prepare your food safely: ? Wash your hands after handling raw meats. ? Keep food preparation surfaces clean by regularly washing with hot, soapy water. ? Keep raw meats separate from ready-to-eat foods, such as fruits and vegetables. ? Cook seafood, meat, poultry, and eggs to the recommended internal temperature. ? Store foods at safe temperatures. In general:  Keep cold foods at 59F (4.4C) or below.  Keep hot foods at 159F (60C) or above.  Keep your freezer at [facility] (-17.8C) or below.  Foods are no longer safe to eat when they have been between the temperatures of 40-159F (4.4-60C) for more than 2 hours. What foods should I eat? Fruits Aim to eat 2 cup-equivalents of fresh, canned (in natural juice), or frozen fruits each day. Examples of 1 cup-equivalent of fruit include 1 small apple, 8 large strawberries, 1 cup canned fruit,  cup  dried fruit, or 1 cup 100% juice. Vegetables Aim to eat 2-3 cup-equivalents of fresh and frozen vegetables each day, including different varieties and colors. Examples of 1 cup-equivalent of vegetables include 2 medium carrots, 2 cups raw, leafy greens, 1 cup chopped  vegetable (raw or cooked), or 1 medium baked potato. Grains Aim to eat 6 ounce-equivalents of whole grains each day. Examples of 1 ounce-equivalent of grains include 1 slice of bread, 1 cup ready-to-eat cereal, 3 cups popcorn, or  cup cooked rice, pasta, or cereal. Meats and other proteins Aim to eat 5-6 ounce-equivalents of protein each day. Examples of 1 ounce-equivalent of protein include 1 egg, 1/2 cup nuts or seeds, or 1 tablespoon (16 g) peanut butter. A cut of meat or fish that is the size of a deck of cards is about 3-4 ounce-equivalents.  Of the protein you eat each week, try to have at least 8 ounces come from seafood. This includes salmon, trout, herring, and anchovies. Dairy Aim to eat 3 cup-equivalents of fat-free or low-fat dairy each day. Examples of 1 cup-equivalent of dairy include 1 cup (240 mL) milk, 8 ounces (250 g) yogurt, 1 ounces (44 g) natural cheese, or 1 cup (240 mL) fortified soy milk. Fats and oils  Aim for about 5 teaspoons (21 g) per day. Choose monounsaturated fats, such as canola and olive oils, avocados, peanut butter, and most nuts, or polyunsaturated fats, such as sunflower, corn, and soybean oils, walnuts, pine nuts, sesame seeds, sunflower seeds, and flaxseed. Beverages  Aim for six 8-oz glasses of water per day. Limit coffee to three to five 8-oz cups per day.  Limit caffeinated beverages that have added calories, such as soda and energy drinks.  Limit alcohol intake to no more than 1 drink a day for nonpregnant women and 2 drinks a day for men. One drink equals 12 oz of beer (355 mL), 5 oz of wine (148 mL), or 1 oz of hard liquor (44 mL). Seasoning and other foods  Avoid adding excess amounts of salt to your foods. Try flavoring foods with herbs and spices instead of salt.  Avoid adding sugar to foods.  Try using oil-based dressings, sauces, and spreads instead of solid fats. This information is based on general [place] nutrition guidelines. For more  information, visit [employer].es. Exact amounts may vary based on your nutrition needs. Summary  A healthy eating plan may help you to maintain a healthy weight, reduce the risk of chronic diseases, and stay active throughout your life.  Plan your meals. Make sure you eat the right portions of a variety of nutrient-rich foods.  Try baking, boiling, grilling, or broiling instead of frying.  Choose healthy options in all settings, including home, work, school, restaurants, or stores. This information is not intended to replace advice given to you by your health care provider. Make sure you discuss any questions you have with your health care provider. Document Revised: 11/14/2017 Document Reviewed: 11/14/2017 Elsevier Patient Education  [place].

## 2020-06-24 LAB — COMPREHENSIVE METABOLIC PANEL
ALT: 11 IU/L (ref 0–32)
AST: 15 IU/L (ref 0–40)
Albumin/Globulin Ratio: 1.6 (ref 1.2–2.2)
Albumin: 4.6 g/dL (ref 3.8–4.9)
Alkaline Phosphatase: 126 IU/L — ABNORMAL HIGH (ref 44–121)
BUN/Creatinine Ratio: 13 (ref 9–23)
BUN: 10 mg/dL (ref 6–24)
Bilirubin Total: 0.4 mg/dL (ref 0.0–1.2)
CO2: 21 mmol/L (ref 20–29)
Calcium: 9.9 mg/dL (ref 8.7–10.2)
Chloride: 102 mmol/L (ref 96–106)
Creatinine, Ser: 0.8 mg/dL (ref 0.57–1.00)
GFR calc Af Amer: 99 mL/min/{1.73_m2} (ref >59)
GFR calc non Af Amer: 86 mL/min/{1.73_m2} (ref >59)
Globulin, Total: 2.8 g/dL (ref 1.5–4.5)
Glucose: 91 mg/dL (ref 65–99)
Potassium: 3.9 mmol/L (ref 3.5–5.2)
Sodium: 138 mmol/L (ref 134–144)
Total Protein: 7.4 g/dL (ref 6.0–8.5)

## 2020-06-24 LAB — LIPID PANEL W/O CHOL/HDL RATIO
Cholesterol, Total: 170 mg/dL (ref 100–199)
HDL: 40 mg/dL (ref >39)
LDL Chol Calc (NIH): 90 mg/dL (ref 0–99)
Triglycerides: 239 mg/dL — ABNORMAL HIGH (ref 0–149)
VLDL Cholesterol Cal: 40 mg/dL (ref 5–40)

## 2020-06-24 LAB — CBC WITH DIFFERENTIAL/PLATELET
Basophils Absolute: 0.1 10*3/uL (ref 0.0–0.2)
Basos: 1 %
EOS (ABSOLUTE): 0.1 10*3/uL (ref 0.0–0.4)
Eos: 1 %
Hematocrit: 41.4 % (ref 34.0–46.6)
Hemoglobin: 13.4 g/dL (ref 11.1–15.9)
Immature Grans (Abs): 0 10*3/uL (ref 0.0–0.1)
Immature Granulocytes: 0 %
Lymphocytes Absolute: 2.8 10*3/uL (ref 0.7–3.1)
Lymphs: 28 %
MCH: 28.2 pg (ref 26.6–33.0)
MCHC: 32.4 g/dL (ref 31.5–35.7)
MCV: 87 fL (ref 79–97)
Monocytes Absolute: 0.9 10*3/uL (ref 0.1–0.9)
Monocytes: 9 %
Neutrophils Absolute: 6.2 10*3/uL (ref 1.4–7.0)
Neutrophils: 61 %
Platelets: 383 10*3/uL (ref 150–450)
RBC: 4.76 x10E6/uL (ref 3.77–5.28)
RDW: 12.5 % (ref 11.7–15.4)
WBC: 10.1 10*3/uL (ref 3.4–10.8)

## 2020-06-24 LAB — HEPATITIS C ANTIBODY: Hep C Virus Ab: <0.1 s/co ratio (ref 0.0–0.9)

## 2020-06-24 LAB — TSH: TSH: 0.804 u[IU]/mL (ref 0.450–4.500)

## 2020-06-24 NOTE — Progress Notes (Signed)
Contacted via MyChart The 10-year ASCVD risk score Wanda Bussing DC Jr., et al., 2013) is: 9.1%   Values used to calculate the score:     Age: 49 years     Sex: Female     Is Non-Hispanic African American: Yes     Diabetic: No     Tobacco smoker: Yes     Systolic Blood Pressure: 852 mmHg     Is BP treated: Yes     HDL Cholesterol: 40 mg/dL     Total Cholesterol: 170 mg/dL  Good afternoon Wanda Hudson, your labs have returned and everything remains stable.  Continue all current medications, you are doing a great job.  If any questions let me know. Keep being awesome!!  Thank you for allowing me to participate in your care. Kindest regards, Wanda Hudson

## 2020-08-04 ENCOUNTER — Ambulatory Visit: Payer: BC Managed Care – PPO

## 2020-08-05 ENCOUNTER — Ambulatory Visit (INDEPENDENT_AMBULATORY_CARE_PROVIDER_SITE_OTHER): Payer: BC Managed Care – PPO

## 2020-08-05 ENCOUNTER — Other Ambulatory Visit: Payer: Self-pay

## 2020-08-05 DIAGNOSIS — Z3042 Encounter for surveillance of injectable contraceptive: Secondary | ICD-10-CM

## 2020-08-05 MED ORDER — MEDROXYPROGESTERONE ACETATE 150 MG/ML IM SUSP
150.0000 mg | Freq: Once | INTRAMUSCULAR | Status: AC
  Administered 2020-08-05: 150 mg via INTRAMUSCULAR

## 2020-09-04 ENCOUNTER — Encounter: Payer: Self-pay | Admitting: Nurse Practitioner

## 2020-09-04 ENCOUNTER — Ambulatory Visit (INDEPENDENT_AMBULATORY_CARE_PROVIDER_SITE_OTHER): Payer: BC Managed Care – PPO | Admitting: Nurse Practitioner

## 2020-09-04 ENCOUNTER — Other Ambulatory Visit: Payer: Self-pay

## 2020-09-04 VITALS — BP 123/75 | HR 98 | Temp 98.5°F | Ht 65.75 in | Wt 125.8 lb

## 2020-09-04 DIAGNOSIS — K625 Hemorrhage of anus and rectum: Secondary | ICD-10-CM | POA: Insufficient documentation

## 2020-09-04 LAB — CBC WITH DIFFERENTIAL/PLATELET
Hematocrit: 38.4 % (ref 34.0–46.6)
Hemoglobin: 12.7 g/dL (ref 11.1–15.9)
Lymphocytes Absolute: 2.5 10*3/uL (ref 0.7–3.1)
Lymphs: 32 %
MCH: 29.1 pg (ref 26.6–33.0)
MCHC: 33.1 g/dL (ref 31.5–35.7)
MCV: 88 fL (ref 79–97)
MID (Absolute): 0.7 10*3/uL (ref 0.1–1.6)
MID: 10 %
Neutrophils Absolute: 4.4 10*3/uL (ref 1.4–7.0)
Neutrophils: 58 %
Platelets: 331 10*3/uL (ref 150–450)
RBC: 4.37 x10E6/uL (ref 3.77–5.28)
RDW: 13.9 % (ref 11.7–15.4)
WBC: 7.6 10*3/uL (ref 3.4–10.8)

## 2020-09-04 MED ORDER — HYDROCORTISONE ACETATE 25 MG RE SUPP: 25.0000 mg | Freq: Two times a day (BID) | RECTAL | 0 refills | Status: DC

## 2020-09-04 NOTE — Progress Notes (Signed)
BP 123/75   Pulse 98   Temp 98.5 F (36.9 C) (Oral)   Ht 5' 5.75" (1.67 m)   Wt 125 lb 12.8 oz (57.1 kg)   SpO2 98%   BMI 20.46 kg/m    Subjective:    Patient ID: Wanda Hudson, female    DOB: [birth year], 50 y.o.   MRN: [id number]  HPI: Wanda Hudson is a 50 y.o. female  Chief Complaint  Patient presents with  . Rectal Bleeding    Started on Sunday, was rather like a blow out, bleed a lot, Still having some bleeding today but not like Sunday.  Patient states that she felt dizzy and needed to lay down for awhile.   RECTAL BLEEDING Duration: 08/31/20 -- Sunday was worst day with a "blow of blood" -- had BM and large amount bright red blood present, then a little bit on Monday like dark little clots -- no bleeding since then.  At the time on Sunday of bleeding she did feel dizzy, room spinning when she stood up, so she laid down.  Since this time has had no dizzy episodes, fatigue, N&V, abdominal pain.  Has not had episodes like this before.  At this time no further symptoms.  She does endorse discomfort with her external hemorrhoid -- pressure. Has regular BM and reports rarely straining.  She is a smoker. Bright red rectal bleeding: yes  Amount of blood: moderate  Frequency:  X 2 episodes Melena: no  Spotting on toilet tissue: a little bit at present  Anal fullness: yes  Perianal pain: no  Severity:  moderate Perianal irritation/itching: no  Constipation: no  Chronic straining/valsava:  this morning she did  Anal trauma/intercourse: no  Hemorrhoids: yes  Previous colonoscopy: yes on 06/29/2019 with polyps seen and internal hemorrhoids Grade 1  Relevant past medical, surgical, family and social history reviewed and updated as indicated. Interim medical history since our last visit reviewed. Allergies and medications reviewed and updated.  Review of Systems  Constitutional: Negative for activity change, appetite change, fatigue and fever.  Respiratory: Negative for  cough, chest tightness, shortness of breath and wheezing.   Cardiovascular: Negative for chest pain, palpitations and leg swelling.  Gastrointestinal: Positive for blood in stool. Negative for abdominal distention, abdominal pain, constipation, diarrhea, nausea, rectal pain and vomiting.  Endocrine: Negative.   Neurological: Negative for dizziness, syncope, weakness, light-headedness, numbness and headaches.  Psychiatric/Behavioral: Negative.     Per HPI unless specifically indicated above     Objective:    BP 123/75   Pulse 98   Temp 98.5 F (36.9 C) (Oral)   Ht 5' 5.75" (1.67 m)   Wt 125 lb 12.8 oz (57.1 kg)   SpO2 98%   BMI 20.46 kg/m   Wt Readings from Last 3 Encounters:  09/04/20 125 lb 12.8 oz (57.1 kg)  06/23/20 124 lb 9.6 oz (56.5 kg)  01/30/20 123 lb 6.4 oz (56 kg)    Physical Exam Vitals and nursing note reviewed.  Constitutional:      General: She is awake. She is not in acute distress.    Appearance: She is well-developed and well-groomed. She is not ill-appearing or toxic-appearing.  HENT:     Head: Normocephalic.     Right Ear: Hearing normal.     Left Ear: Hearing normal.  Eyes:     General: Lids are normal.        Right eye: No discharge.  Left eye: No discharge.     Conjunctiva/sclera: Conjunctivae normal.     Pupils: Pupils are equal, round, and reactive to light.  Neck:     Thyroid: No thyromegaly.     Vascular: No carotid bruit.  Cardiovascular:     Rate and Rhythm: Normal rate and regular rhythm.     Heart sounds: Normal heart sounds. No murmur heard. No gallop.   Pulmonary:     Effort: Pulmonary effort is normal. No accessory muscle usage or respiratory distress.     Breath sounds: Normal breath sounds.  Abdominal:     General: Bowel sounds are normal. There is no distension or abdominal bruit.     Palpations: Abdomen is soft. There is no hepatomegaly.     Tenderness: There is no abdominal tenderness.     Hernia: No hernia is  present.  Genitourinary:    Rectum: External hemorrhoid and internal hemorrhoid present. No mass, tenderness or anal fissure. Normal anal tone.     Comments: External hemorrhoid at approx 12 o'clock, moderately enlarged with some dried up blood noted exterior at hemorrhoid.  Internal hemorrhoid palpated.  No blood on glove with exam. Musculoskeletal:     Cervical back: Normal range of motion and neck supple.     Right lower leg: No edema.     Left lower leg: No edema.  Lymphadenopathy:     Cervical: No cervical adenopathy.  Skin:    General: Skin is warm and dry.  Neurological:     Mental Status: She is alert and oriented to person, place, and time.  Psychiatric:        Attention and Perception: Attention normal.        Mood and Affect: Mood normal.        Behavior: Behavior normal. Behavior is cooperative.        Thought Content: Thought content normal.        Judgment: Judgment normal.     Results for orders placed or performed in visit on 06/23/20  CBC with Differential/Platelet  Result Value Ref Range   WBC 10.1 3.4 - 10.8 x10E3/uL   RBC 4.76 3.77 - 5.28 x10E6/uL   Hemoglobin 13.4 11.1 - 15.9 g/dL   Hematocrit 41.4 34.0 - 46.6 %   MCV 87 79 - 97 fL   MCH 28.2 26.6 - 33.0 pg   MCHC 32.4 31.5 - 35.7 g/dL   RDW 12.5 11.7 - 15.4 %   Platelets 383 150 - 450 x10E3/uL   Neutrophils 61 Not Estab. %   Lymphs 28 Not Estab. %   Monocytes 9 Not Estab. %   Eos 1 Not Estab. %   Basos 1 Not Estab. %   Neutrophils Absolute 6.2 1.4 - 7.0 x10E3/uL   Lymphocytes Absolute 2.8 0.7 - 3.1 x10E3/uL   Monocytes Absolute 0.9 0.1 - 0.9 x10E3/uL   EOS (ABSOLUTE) 0.1 0.0 - 0.4 x10E3/uL   Basophils Absolute 0.1 0.0 - 0.2 x10E3/uL   Immature Granulocytes 0 Not Estab. %   Immature Grans (Abs) 0.0 0.0 - 0.1 x10E3/uL  Comprehensive metabolic panel  Result Value Ref Range   Glucose 91 65 - 99 mg/dL   BUN 10 6 - 24 mg/dL   Creatinine, Ser 0.80 0.57 - 1.00 mg/dL   GFR calc non Af Amer 86 >59  mL/min/1.73   GFR calc Af Amer 99 >59 mL/min/1.73   BUN/Creatinine Ratio 13 9 - 23   Sodium 138 134 - 144 mmol/L   Potassium 3.9  3.5 - 5.2 mmol/L   Chloride 102 96 - 106 mmol/L   CO2 21 20 - 29 mmol/L   Calcium 9.9 8.7 - 10.2 mg/dL   Total Protein 7.4 6.0 - 8.5 g/dL   Albumin 4.6 3.8 - 4.9 g/dL   Globulin, Total 2.8 1.5 - 4.5 g/dL   Albumin/Globulin Ratio 1.6 1.2 - 2.2   Bilirubin Total 0.4 0.0 - 1.2 mg/dL   Alkaline Phosphatase 126 (H) 44 - 121 IU/L   AST 15 0 - 40 IU/L   ALT 11 0 - 32 IU/L  Lipid Panel w/o Chol/HDL Ratio  Result Value Ref Range   Cholesterol, Total 170 100 - 199 mg/dL   Triglycerides 239 (H) 0 - 149 mg/dL   HDL 40 >39 mg/dL   VLDL Cholesterol Cal 40 5 - 40 mg/dL   LDL Chol Calc (NIH) 90 0 - 99 mg/dL  TSH  Result Value Ref Range   TSH 0.804 0.450 - 4.500 uIU/mL  Hepatitis C antibody  Result Value Ref Range   Hep C Virus Ab <0.1 0.0 - 0.9 s/co ratio      Assessment & Plan:   Problem List Items Addressed This Visit      Digestive   Rectal bleed - Primary    Acute x 2 episodes with no further symptoms at this time, improved.  CBC in office noting WBC and neutrophils normal and H/H12.7/38.4.  Suspect bleed related to hemorrhoid internally, but does have external hemorrhoid too which causes discomfort for her.  Recommend OTC Preparation H and will send in script for Anusol.  Placed referral to GI for further assessment.  At this time hemodynamically stable with no further symptoms.  Recommend if return of bleeding and any increase symptoms then immediately notify provider or go to ER.  CMP and TSH today.  Return in 2 weeks for follow-up, sooner if worsening.      Relevant Orders   Comprehensive metabolic panel   TSH   CBC With Differential/Platelet   Fecal occult blood, imunochemical   Ambulatory referral to Gastroenterology       Follow up plan: Return in about 2 weeks (around 09/18/2020) for Rectal bleed.

## 2020-09-04 NOTE — Assessment & Plan Note (Signed)
Acute x 2 episodes with no further symptoms at this time, improved.  CBC in office noting WBC and neutrophils normal and H/H12.7/38.4.  Suspect bleed related to hemorrhoid internally, but does have external hemorrhoid too which causes discomfort for her.  Recommend OTC Preparation H and will send in script for Anusol.  Placed referral to GI for further assessment.  At this time hemodynamically stable with no further symptoms.  Recommend if return of bleeding and any increase symptoms then immediately notify provider or go to ER.  CMP and TSH today.  Return in 2 weeks for follow-up, sooner if worsening.

## 2020-09-04 NOTE — Patient Instructions (Signed)
Hemorrhoids Hemorrhoids are swollen veins that may develop:  In the butt (rectum). These are called internal hemorrhoids.  Around the opening of the butt (anus). These are called external hemorrhoids. Hemorrhoids can cause pain, itching, or bleeding. Most of the time, they do not cause serious problems. They usually get better with diet changes, lifestyle changes, and other home treatments. What are the causes? This condition may be caused by:  Having trouble pooping (constipation).  Pushing hard (straining) to poop.  Watery poop (diarrhea).  Pregnancy.  Being very overweight (obese).  Sitting for long periods of time.  Heavy lifting or other activity that causes you to strain.  Anal sex.  Riding a bike for a long period of time. What are the signs or symptoms? Symptoms of this condition include:  Pain.  Itching or soreness in the butt.  Bleeding from the butt.  Leaking poop.  Swelling in the area.  One or more lumps around the opening of your butt. How is this diagnosed? A doctor can often diagnose this condition by looking at the affected area. The doctor may also:  Do an exam that involves feeling the area with a gloved hand (digital rectal exam).  Examine the area inside your butt using a small tube (anoscope).  Order blood tests. This may be done if you have lost a lot of blood.  Have you get a test that involves looking inside the colon using a flexible tube with a camera on the end (sigmoidoscopy or colonoscopy). How is this treated? This condition can usually be treated at home. Your doctor may tell you to change what you eat, make lifestyle changes, or try home treatments. If these do not help, procedures can be done to remove the hemorrhoids or make them smaller. These may involve:  Placing rubber bands at the base of the hemorrhoids to cut off their blood supply.  Injecting medicine into the hemorrhoids to shrink them.  Shining a type of light  energy onto the hemorrhoids to cause them to fall off.  Doing surgery to remove the hemorrhoids or cut off their blood supply. Follow these instructions at home: Eating and drinking  Eat foods that have a lot of fiber in them. These include whole grains, beans, nuts, fruits, and vegetables.  Ask your doctor about taking products that have added fiber (fibersupplements).  Reduce the amount of fat in your diet. You can do this by: ? Eating low-fat dairy products. ? Eating less red meat. ? Avoiding processed foods.  Drink enough fluid to keep your pee (urine) pale yellow.   Managing pain and swelling  Take a warm-water bath (sitz bath) for 20 minutes to ease pain. Do this 3-4 times a day. You may do this in a bathtub or using a portable sitz bath that fits over the toilet.  If told, put ice on the painful area. It may be helpful to use ice between your warm baths. ? Put ice in a plastic bag. ? Place a towel between your skin and the bag. ? Leave the ice on for 20 minutes, 2-3 times a day.   General instructions  Take over-the-counter and prescription medicines only as told by your doctor. ? Medicated creams and medicines may be used as told.  Exercise often. Ask your doctor how much and what kind of exercise is best for you.  Go to the bathroom when you have the urge to poop. Do not wait.  Avoid pushing too hard when you poop.  Keep your butt dry and clean. Use wet toilet paper or moist towelettes after pooping.  Do not sit on the toilet for a long time.  Keep all follow-up visits as told by your doctor. This is important. Contact a doctor if you:  Have pain and swelling that do not get better with treatment or medicine.  Have trouble pooping.  Cannot poop.  Have pain or swelling outside the area of the hemorrhoids. Get help right away if you have:  Bleeding that will not stop. Summary  Hemorrhoids are swollen veins in the butt or around the opening of the  butt.  They can cause pain, itching, or bleeding.  Eat foods that have a lot of fiber in them. These include whole grains, beans, nuts, fruits, and vegetables.  Take a warm-water bath (sitz bath) for 20 minutes to ease pain. Do this 3-4 times a day. This information is not intended to replace advice given to you by your health care provider. Make sure you discuss any questions you have with your health care provider. Document Revised: 08/10/2018 Document Reviewed: 12/22/2017 Elsevier Patient Education  [place].

## 2020-09-05 LAB — COMPREHENSIVE METABOLIC PANEL
ALT: 17 IU/L (ref 0–32)
AST: 21 IU/L (ref 0–40)
Albumin/Globulin Ratio: 1.7 (ref 1.2–2.2)
Albumin: 4.5 g/dL (ref 3.8–4.9)
Alkaline Phosphatase: 129 IU/L — ABNORMAL HIGH (ref 44–121)
BUN/Creatinine Ratio: 25 — ABNORMAL HIGH (ref 9–23)
BUN: 12 mg/dL (ref 6–24)
Bilirubin Total: 0.3 mg/dL (ref 0.0–1.2)
CO2: 20 mmol/L (ref 20–29)
Calcium: 9.1 mg/dL (ref 8.7–10.2)
Chloride: 105 mmol/L (ref 96–106)
Creatinine, Ser: 0.48 mg/dL — ABNORMAL LOW (ref 0.57–1.00)
GFR calc Af Amer: 131 mL/min/{1.73_m2} (ref >59)
GFR calc non Af Amer: 114 mL/min/{1.73_m2} (ref >59)
Globulin, Total: 2.7 g/dL (ref 1.5–4.5)
Glucose: 104 mg/dL — ABNORMAL HIGH (ref 65–99)
Potassium: 3.5 mmol/L (ref 3.5–5.2)
Sodium: 137 mmol/L (ref 134–144)
Total Protein: 7.2 g/dL (ref 6.0–8.5)

## 2020-09-05 LAB — TSH: TSH: 0.495 u[IU]/mL (ref 0.450–4.500)

## 2020-09-05 NOTE — Progress Notes (Signed)
Contacted via [place] afternoon Wanda Hudson, your labs have returned.  Kidney and liver function remain stable.  Alkaline phosphatase remains slightly elevated and we will continue to monitor this, sometimes this is related to gall bladder.  Thyroid is normal.  Any questions for me? Keep being awesome!!  Thank you for allowing me to participate in your care. Kindest regards, Skila Rollins

## 2020-09-21 ENCOUNTER — Other Ambulatory Visit: Payer: Self-pay | Admitting: Nurse Practitioner

## 2020-09-21 NOTE — Telephone Encounter (Signed)
Requested Prescriptions  Pending Prescriptions Disp Refills  . busPIRone (BUSPAR) 5 MG tablet [Pharmacy Med Name: BUSPIRONE HCL 5 MG TABLET] 90 tablet 0    Sig: TAKE 1 TABLET BY MOUTH TWICE A DAY AS NEEDED---NEED OFFICE VISIT     Psychiatry: Anxiolytics/Hypnotics - Non-controlled Passed - 09/21/2020 12:45 AM      Passed - Valid encounter within last 6 months    Recent Outpatient Visits          2 weeks ago Rectal bleed   [place], Henrine Screws T, NP   3 months ago Encounter for annual physical exam   [facility] [place], [place] T, NP   7 months ago Decrease in appetite   [place], [place] T, NP   8 months ago Generalized anxiety disorder   [place], Henrine Screws T, NP   1 year ago History of abnormal cervical Pap smear   [place], Barbaraann Faster, NP      Future Appointments            In 3 months Cannady, Barbaraann Faster, NP [facility]

## 2020-09-22 ENCOUNTER — Ambulatory Visit: Payer: BC Managed Care – PPO | Admitting: Nurse Practitioner

## 2020-10-21 ENCOUNTER — Other Ambulatory Visit: Payer: Self-pay

## 2020-10-21 ENCOUNTER — Ambulatory Visit (INDEPENDENT_AMBULATORY_CARE_PROVIDER_SITE_OTHER): Payer: BC Managed Care – PPO

## 2020-10-21 DIAGNOSIS — Z3042 Encounter for surveillance of injectable contraceptive: Secondary | ICD-10-CM | POA: Diagnosis not present

## 2020-10-21 MED ORDER — MEDROXYPROGESTERONE ACETATE 150 MG/ML IM SUSP
150.0000 mg | Freq: Once | INTRAMUSCULAR | Status: AC
  Administered 2020-10-21: 150 mg via INTRAMUSCULAR

## 2020-10-28 ENCOUNTER — Other Ambulatory Visit: Payer: Self-pay

## 2020-10-28 ENCOUNTER — Encounter: Payer: Self-pay | Admitting: Gastroenterology

## 2020-10-28 ENCOUNTER — Ambulatory Visit (INDEPENDENT_AMBULATORY_CARE_PROVIDER_SITE_OTHER): Payer: BC Managed Care – PPO | Admitting: Gastroenterology

## 2020-10-28 VITALS — BP 151/81 | HR 97 | Ht 63.0 in | Wt 125.0 lb

## 2020-10-28 DIAGNOSIS — K625 Hemorrhage of anus and rectum: Secondary | ICD-10-CM

## 2020-10-28 NOTE — Progress Notes (Signed)
Primary Care Physician: Venita Lick, NP  Primary Gastroenterologist:  Dr. Lucilla Lame  Chief Complaint  Patient presents with  . New Patient (Initial Visit)    Rectal bleeding    HPI: Wanda Hudson is a 50 y.o. female here with a history of having a colonoscopy in 2020 with polyps found at that time.  The patient was recommended to have a repeat colonoscopy in 10 years because they were not adenomatous.  The patient reports that she had one episode of bright red blood per rectum.  She states that she has an external hemorrhoid.  The patient's colonoscopy did show internal hemorrhoids.  The patient has had no rectal pain change in bowel habits or unexplained weight loss.  She states she has not had any further episodes of rectal bleeding.  She was concerned so she came in to see me today  Past Medical History:  Diagnosis Date  . Anxiety   . Breast cyst, left 2017  . Depression   . Hypertension   . Insomnia   . Osteopenia   . Peripheral neuropathy     Current Outpatient Medications  Medication Sig Dispense Refill  . Alpha-Lipoic Acid 100 MG TABS Take 1 tablet by mouth daily.    Marland Kitchen amLODipine (NORVASC) 5 MG tablet TAKE 1 TABLET BY MOUTH EVERY DAY 30 tablet 11  . Aspirin-Salicylamide-Caffeine 939-030-09.2 MG PACK Take by mouth as needed.    . busPIRone (BUSPAR) 5 MG tablet TAKE 1 TABLET BY MOUTH TWICE A DAY AS NEEDED---NEED OFFICE VISIT 90 tablet 0  . Cyanocobalamin (B-12) 1000 MCG CAPS B12    . Evening Primrose Oil 500 MG CAPS Take 500 mg by mouth daily.    . sertraline (ZOLOFT) 25 MG tablet TAKE 1 TABLET BY MOUTH EVERY DAY 90 tablet 4  . Vitamin D, Cholecalciferol, 1000 UNITS TABS Take 1,000 Units by mouth daily.    . hydrocortisone (ANUSOL-HC) 25 MG suppository Place 1 suppository (25 mg total) rectally 2 (two) times daily. (Patient not taking: Reported on 10/28/2020) 12 suppository 0   No current facility-administered medications for this visit.    Allergies as of  10/28/2020 - Review Complete 10/28/2020  Allergen Reaction Noted  . Codeine Rash 06/12/2013    ROS:  General: Negative for anorexia, weight loss, fever, chills, fatigue, weakness. ENT: Negative for hoarseness, difficulty swallowing , nasal congestion. CV: Negative for chest pain, angina, palpitations, dyspnea on exertion, peripheral edema.  Respiratory: Negative for dyspnea at rest, dyspnea on exertion, cough, sputum, wheezing.  GI: See history of present illness. GU:  Negative for dysuria, hematuria, urinary incontinence, urinary frequency, nocturnal urination.  Endo: Negative for unusual weight change.    Physical Examination:   BP (!) 151/81   Pulse 97   Ht 5\' 3"  (1.6 m)   Wt 125 lb (56.7 kg)   BMI 22.14 kg/m   General: Well-nourished, well-developed in no acute distress.  Eyes: No icterus. Conjunctivae pink. Neuro: Alert and oriented x 3.  Grossly intact. Skin: Warm and dry, no jaundice.   Psych: Alert and cooperative, normal mood and affect.  Labs:    Imaging Studies: No results found.  Assessment and Plan:   Wanda Hudson is a 50 y.o. y/o female who comes in today with a single episode of rectal bleeding a few weeks ago.  The patient has had no further rectal bleeding.  The patient has no worry symptoms such as unexplained weight loss or change in bowel habits.  Since the patient's colonoscopy was in November 2020 the patient has been reassured that the bright red blood per rectum was likely from hemorrhoids.  The patient has been told that if it happens again we can treat it with medication or set her up for hemorrhoidal banding with one of my partners.  The patient states that she will contact me if the bleeding returns.  The patient has been explained the plan agrees with it.     Lucilla Lame, MD. [facility]    Note: This dictation was prepared with Dragon dictation along with smaller phrase technology. Any transcriptional errors that result from this process are  unintentional.

## 2020-11-02 ENCOUNTER — Other Ambulatory Visit: Payer: Self-pay | Admitting: Nurse Practitioner

## 2020-11-02 NOTE — Telephone Encounter (Signed)
Requested Prescriptions  Pending Prescriptions Disp Refills  . busPIRone (BUSPAR) 5 MG tablet [Pharmacy Med Name: BUSPIRONE HCL 5 MG TABLET] 90 tablet 0    Sig: TAKE 1 TABLET BY MOUTH TWICE A DAY AS NEEDED---NEED OFFICE VISIT     Psychiatry: Anxiolytics/Hypnotics - Non-controlled Passed - 11/02/2020 12:46 AM      Passed - Valid encounter within last 6 months    Recent Outpatient Visits          1 month ago Rectal bleed   [place], Henrine Screws T, NP   4 months ago Encounter for annual physical exam   [employer], [place] T, NP   9 months ago Decrease in appetite   [place], [place] T, NP   10 months ago Generalized anxiety disorder   [place], Henrine Screws T, NP   1 year ago History of abnormal cervical Pap smear   [place], Barbaraann Faster, NP      Future Appointments            In 1 month Cannady, Barbaraann Faster, NP [facility]

## 2020-12-03 ENCOUNTER — Other Ambulatory Visit: Payer: Self-pay | Admitting: Nurse Practitioner

## 2020-12-03 NOTE — Telephone Encounter (Signed)
Requested Prescriptions  Pending Prescriptions Disp Refills  . busPIRone (BUSPAR) 5 MG tablet [Pharmacy Med Name: BUSPIRONE HCL 5 MG TABLET] 90 tablet 0    Sig: TAKE 1 TABLET BY MOUTH TWICE A DAY AS NEEDED---NEED OFFICE VISIT     Psychiatry: Anxiolytics/Hypnotics - Non-controlled Passed - 12/03/2020  1:30 AM      Passed - Valid encounter within last 6 months    Recent Outpatient Visits          3 months ago Rectal bleed   [place], Henrine Screws T, NP   5 months ago Encounter for annual physical exam   [employer], [place] T, NP   10 months ago Decrease in appetite   [place], [place] T, NP   11 months ago Generalized anxiety disorder   [place], [place] T, NP   1 year ago History of abnormal cervical Pap smear   [place], Barbaraann Faster, NP      Future Appointments            In 2 weeks Cannady, Barbaraann Faster, NP [facility]

## 2020-12-10 ENCOUNTER — Other Ambulatory Visit: Payer: Self-pay | Admitting: Family Medicine

## 2020-12-11 LAB — CMP12+LP+TP+TSH+6AC+CBC/D/PLT
ALT: 15 IU/L (ref 0–32)
AST: 23 IU/L (ref 0–40)
Albumin/Globulin Ratio: 1.9 (ref 1.2–2.2)
Albumin: 4.6 g/dL (ref 3.8–4.9)
Alkaline Phosphatase: 118 IU/L (ref 44–121)
BUN/Creatinine Ratio: 21 (ref 9–23)
BUN: 10 mg/dL (ref 6–24)
Basophils Absolute: 0.1 10*3/uL (ref 0.0–0.2)
Basos: 1 %
Bilirubin Total: 0.2 mg/dL (ref 0.0–1.2)
Calcium: 9.4 mg/dL (ref 8.7–10.2)
Chloride: 101 mmol/L (ref 96–106)
Chol/HDL Ratio: 4.2 ratio (ref 0.0–4.4)
Cholesterol, Total: 188 mg/dL (ref 100–199)
Creatinine, Ser: 0.47 mg/dL — ABNORMAL LOW (ref 0.57–1.00)
EOS (ABSOLUTE): 0.1 10*3/uL (ref 0.0–0.4)
Eos: 1 %
Estimated CHD Risk: 0.9 times avg. (ref 0.0–1.0)
Free Thyroxine Index: 1.7 (ref 1.2–4.9)
GGT: 266 IU/L — ABNORMAL HIGH (ref 0–60)
Globulin, Total: 2.4 g/dL (ref 1.5–4.5)
Glucose: 92 mg/dL (ref 65–99)
HDL: 45 mg/dL (ref >39)
Hematocrit: 38 % (ref 34.0–46.6)
Hemoglobin: 12.6 g/dL (ref 11.1–15.9)
Immature Grans (Abs): 0 10*3/uL (ref 0.0–0.1)
Immature Granulocytes: 0 %
Iron: 91 ug/dL (ref 27–159)
LDH: 198 IU/L (ref 119–226)
LDL Chol Calc (NIH): 113 mg/dL — ABNORMAL HIGH (ref 0–99)
Lymphocytes Absolute: 2.8 10*3/uL (ref 0.7–3.1)
Lymphs: 30 %
MCH: 29 pg (ref 26.6–33.0)
MCHC: 33.2 g/dL (ref 31.5–35.7)
MCV: 87 fL (ref 79–97)
Monocytes Absolute: 0.7 10*3/uL (ref 0.1–0.9)
Monocytes: 8 %
Neutrophils Absolute: 5.6 10*3/uL (ref 1.4–7.0)
Neutrophils: 60 %
Phosphorus: 3.7 mg/dL (ref 3.0–4.3)
Platelets: 337 10*3/uL (ref 150–450)
Potassium: 3.7 mmol/L (ref 3.5–5.2)
RBC: 4.35 x10E6/uL (ref 3.77–5.28)
RDW: 12.7 % (ref 11.7–15.4)
Sodium: 137 mmol/L (ref 134–144)
T3 Uptake Ratio: 26 % (ref 24–39)
T4, Total: 6.7 ug/dL (ref 4.5–12.0)
TSH: 0.597 u[IU]/mL (ref 0.450–4.500)
Total Protein: 7 g/dL (ref 6.0–8.5)
Triglycerides: 169 mg/dL — ABNORMAL HIGH (ref 0–149)
Uric Acid: 5.3 mg/dL (ref 3.0–7.2)
VLDL Cholesterol Cal: 30 mg/dL (ref 5–40)
WBC: 9.2 10*3/uL (ref 3.4–10.8)
eGFR: 115 mL/min/{1.73_m2} (ref >59)

## 2020-12-22 ENCOUNTER — Ambulatory Visit: Payer: BC Managed Care – PPO | Admitting: Nurse Practitioner

## 2021-01-06 ENCOUNTER — Other Ambulatory Visit: Payer: Self-pay

## 2021-01-06 ENCOUNTER — Ambulatory Visit (INDEPENDENT_AMBULATORY_CARE_PROVIDER_SITE_OTHER): Payer: BC Managed Care – PPO

## 2021-01-06 DIAGNOSIS — Z3042 Encounter for surveillance of injectable contraceptive: Secondary | ICD-10-CM

## 2021-01-06 MED ORDER — MEDROXYPROGESTERONE ACETATE 150 MG/ML IM SUSP
150.0000 mg | Freq: Once | INTRAMUSCULAR | Status: AC
  Administered 2021-01-06: 150 mg via INTRAMUSCULAR

## 2021-01-16 ENCOUNTER — Other Ambulatory Visit: Payer: Self-pay | Admitting: Nurse Practitioner

## 2021-01-16 NOTE — Telephone Encounter (Signed)
Requested medications are due for refill today yes  Requested medications are on the active medication list yes  Last refill 12/03/20  Last visit 06/23/20  Future visit scheduled 03/24/21  Notes to clinic Has already had a curtesy refill (45 days worth), HOWEVER, there is an upcoming appointment scheduled for 03/24/21, please assess.

## 2021-01-19 NOTE — Telephone Encounter (Signed)
Scheduled 6/17 doesn't have enough to last until appt

## 2021-01-25 ENCOUNTER — Encounter: Payer: Self-pay | Admitting: Nurse Practitioner

## 2021-01-25 DIAGNOSIS — E7800 Pure hypercholesterolemia, unspecified: Secondary | ICD-10-CM | POA: Insufficient documentation

## 2021-01-30 ENCOUNTER — Ambulatory Visit (INDEPENDENT_AMBULATORY_CARE_PROVIDER_SITE_OTHER): Payer: BC Managed Care – PPO | Admitting: Nurse Practitioner

## 2021-01-30 ENCOUNTER — Other Ambulatory Visit: Payer: Self-pay

## 2021-01-30 ENCOUNTER — Encounter: Payer: Self-pay | Admitting: Nurse Practitioner

## 2021-01-30 VITALS — BP 130/85 | HR 87 | Temp 98.9°F | Wt 126.8 lb

## 2021-01-30 DIAGNOSIS — M8580 Other specified disorders of bone density and structure, unspecified site: Secondary | ICD-10-CM

## 2021-01-30 DIAGNOSIS — I10 Essential (primary) hypertension: Secondary | ICD-10-CM

## 2021-01-30 DIAGNOSIS — F411 Generalized anxiety disorder: Secondary | ICD-10-CM

## 2021-01-30 DIAGNOSIS — E538 Deficiency of other specified B group vitamins: Secondary | ICD-10-CM | POA: Diagnosis not present

## 2021-01-30 DIAGNOSIS — E559 Vitamin D deficiency, unspecified: Secondary | ICD-10-CM

## 2021-01-30 MED ORDER — TRIAMCINOLONE ACETONIDE 0.1 % EX CREA: 1.0000 "application " | TOPICAL_CREAM | Freq: Two times a day (BID) | CUTANEOUS | 0 refills | Status: DC

## 2021-01-30 MED ORDER — AMLODIPINE BESYLATE 5 MG PO TABS: 1.0000 | ORAL_TABLET | Freq: Every day | ORAL | 4 refills | Status: DC

## 2021-01-30 NOTE — Assessment & Plan Note (Signed)
History of reported on chart, no DEXA noted.  Check Vit D level at physical.  Dexa due at 63.

## 2021-01-30 NOTE — Assessment & Plan Note (Signed)
Chronic, stable with BP at goal.  Recommend she monitor BP at least a few mornings a week at home and document.  DASH diet at home.  Continue current medication regimen and adjust as needed.  Labs at physical.  Return in 6 months.

## 2021-01-30 NOTE — Progress Notes (Signed)
BP 130/85   Pulse 87   Temp 98.9 F (37.2 C) (Oral)   Wt 126 lb 12.8 oz (57.5 kg)   SpO2 99%   BMI 22.46 kg/m    Subjective:    Patient ID: Wanda Hudson, female    DOB: 08-25-1970, 50 y.o.   MRN: [id number]  HPI: Wanda Hudson is a 50 y.o. female  Chief Complaint  Patient presents with   Mood   Osteopenia   Vitamin D   B12 Check   ANXIETY/STRESS Continues on Sertraline 25 MG daily and Buspar 5 MG BID PRN. Duration:controlled Anxious mood: no  Excessive worrying: no Irritability: no  Sweating: no Nausea: no Palpitations:no Hyperventilation: no Panic attacks: no Agoraphobia: no  Obscessions/compulsions: no Depressed mood: no Depression screen [facility] 2/9 01/30/2021 09/04/2020 06/23/2020 12/31/2019 07/02/2019  Decreased Interest 0 0 1 0 0  Down, Depressed, Hopeless 0 0 2 0 0  PHQ - 2 Score 0 0 3 0 0  Altered sleeping - 0 1 0 0  Tired, decreased energy - 0 1 0 0  Change in appetite - 0 0 1 0  Feeling bad or failure about yourself  - 0 0 0 0  Trouble concentrating - 0 0 0 0  Moving slowly or fidgety/restless - 0 0 0 0  Suicidal thoughts - 0 0 0 0  PHQ-9 Score - 0 5 1 0  Difficult doing work/chores - - Somewhat difficult Not difficult at all Not difficult at all   Anhedonia: no Weight changes: no Insomnia: none Hypersomnia: no Fatigue/loss of energy: no Feelings of worthlessness: no Feelings of guilt: no Impaired concentration/indecisiveness: no Suicidal ideations: no  Crying spells: no Recent Stressors/Life Changes: no   Relationship problems: no   Family stress: no     Financial stress: no    Job stress: no    Recent death/loss: no   OSTEOPENIA Continues on Vitamin D daily. No recent DEXA on file.  History of low B12 level with last check was 221 in 2020. Adequate calcium & vitamin D: yes Weight bearing exercises: no   Relevant past medical, surgical, family and social history reviewed and updated as indicated. Interim medical history since our  last visit reviewed. Allergies and medications reviewed and updated.  Review of Systems  Constitutional:  Negative for activity change, appetite change, diaphoresis, fatigue and fever.  Respiratory:  Negative for cough, chest tightness and shortness of breath.   Cardiovascular:  Negative for chest pain, palpitations and leg swelling.  Gastrointestinal: Negative.   Neurological: Negative.   Psychiatric/Behavioral: Negative.     Per HPI unless specifically indicated above     Objective:    BP 130/85   Pulse 87   Temp 98.9 F (37.2 C) (Oral)   Wt 126 lb 12.8 oz (57.5 kg)   SpO2 99%   BMI 22.46 kg/m   Wt Readings from Last 3 Encounters:  01/30/21 126 lb 12.8 oz (57.5 kg)  10/28/20 125 lb (56.7 kg)  09/04/20 125 lb 12.8 oz (57.1 kg)    Physical Exam Vitals and nursing note reviewed.  Constitutional:      General: She is awake. She is not in acute distress.    Appearance: She is well-developed and well-groomed. She is not ill-appearing or toxic-appearing.  HENT:     Head: Normocephalic.     Right Ear: Hearing normal.     Left Ear: Hearing normal.  Eyes:     General: Lids are normal.  Right eye: No discharge.        Left eye: No discharge.     Conjunctiva/sclera: Conjunctivae normal.     Pupils: Pupils are equal, round, and reactive to light.  Neck:     Thyroid: No thyromegaly.     Vascular: No carotid bruit or JVD.  Cardiovascular:     Rate and Rhythm: Normal rate and regular rhythm.     Heart sounds: Normal heart sounds. No murmur heard.   No gallop.  Pulmonary:     Effort: Pulmonary effort is normal.     Breath sounds: Normal breath sounds.  Abdominal:     General: Bowel sounds are normal.     Palpations: Abdomen is soft. There is no hepatomegaly or splenomegaly.  Musculoskeletal:     Cervical back: Normal range of motion and neck supple.     Right lower leg: No edema.     Left lower leg: No edema.  Lymphadenopathy:     Cervical: No cervical adenopathy.   Skin:    General: Skin is warm and dry.  Neurological:     Mental Status: She is alert and oriented to person, place, and time.  Psychiatric:        Attention and Perception: Attention normal.        Mood and Affect: Mood normal.        Behavior: Behavior normal. Behavior is cooperative.        Thought Content: Thought content normal.        Judgment: Judgment normal.    Results for orders placed or performed in visit on 12/10/20  CMP12+LP+TP+TSH+6AC+CBC/D/Plt  Result Value Ref Range   Glucose 92 65 - 99 mg/dL   Uric Acid 5.3 3.0 - 7.2 mg/dL   BUN 10 6 - 24 mg/dL   Creatinine, Ser 0.47 (L) 0.57 - 1.00 mg/dL   eGFR 115 >59 mL/min/1.73   BUN/Creatinine Ratio 21 9 - 23   Sodium 137 134 - 144 mmol/L   Potassium 3.7 3.5 - 5.2 mmol/L   Chloride 101 96 - 106 mmol/L   Calcium 9.4 8.7 - 10.2 mg/dL   Phosphorus 3.7 3.0 - 4.3 mg/dL   Total Protein 7.0 6.0 - 8.5 g/dL   Albumin 4.6 3.8 - 4.9 g/dL   Globulin, Total 2.4 1.5 - 4.5 g/dL   Albumin/Globulin Ratio 1.9 1.2 - 2.2   Bilirubin Total 0.2 0.0 - 1.2 mg/dL   Alkaline Phosphatase 118 44 - 121 IU/L   LDH 198 119 - 226 IU/L   AST 23 0 - 40 IU/L   ALT 15 0 - 32 IU/L   GGT 266 (H) 0 - 60 IU/L   Iron 91 27 - 159 ug/dL   Cholesterol, Total 188 100 - 199 mg/dL   Triglycerides 169 (H) 0 - 149 mg/dL   HDL 45 >39 mg/dL   VLDL Cholesterol Cal 30 5 - 40 mg/dL   LDL Chol Calc (NIH) 113 (H) 0 - 99 mg/dL   Chol/HDL Ratio 4.2 0.0 - 4.4 ratio   Estimated CHD Risk 0.9 0.0 - 1.0 times avg.   TSH 0.597 0.450 - 4.500 uIU/mL   T4, Total 6.7 4.5 - 12.0 ug/dL   T3 Uptake Ratio 26 24 - 39 %   Free Thyroxine Index 1.7 1.2 - 4.9   WBC 9.2 3.4 - 10.8 x10E3/uL   RBC 4.35 3.77 - 5.28 x10E6/uL   Hemoglobin 12.6 11.1 - 15.9 g/dL   Hematocrit 38.0 34.0 - 46.6 %  MCV 87 79 - 97 fL   MCH 29.0 26.6 - 33.0 pg   MCHC 33.2 31.5 - 35.7 g/dL   RDW 12.7 11.7 - 15.4 %   Platelets 337 150 - 450 x10E3/uL   Neutrophils 60 Not Estab. %   Lymphs 30 Not Estab. %    Monocytes 8 Not Estab. %   Eos 1 Not Estab. %   Basos 1 Not Estab. %   Neutrophils Absolute 5.6 1.4 - 7.0 x10E3/uL   Lymphocytes Absolute 2.8 0.7 - 3.1 x10E3/uL   Monocytes Absolute 0.7 0.1 - 0.9 x10E3/uL   EOS (ABSOLUTE) 0.1 0.0 - 0.4 x10E3/uL   Basophils Absolute 0.1 0.0 - 0.2 x10E3/uL   Immature Granulocytes 0 Not Estab. %   Immature Grans (Abs) 0.0 0.0 - 0.1 x10E3/uL      Assessment & Plan:   Problem List Items Addressed This Visit       Cardiovascular and Mediastinum   Benign hypertension - Primary    Chronic, stable with BP at goal.  Recommend she monitor BP at least a few mornings a week at home and document.  DASH diet at home.  Continue current medication regimen and adjust as needed.  Labs at physical.  Return in 6 months.        Relevant Medications   amLODipine (NORVASC) 5 MG tablet     Musculoskeletal and Integument   Osteopenia    History of reported on chart, no DEXA noted.  Check Vit D level at physical.  Dexa due at 63.         Other   B12 deficiency    Ongoing.  History of level <300 and reports neuropathy discomfort.  Recheck labs at physical.  Recommend continue Vitamin B12 orally 1000 MCG daily.       Generalized anxiety disorder    Chronic, stable. Continue Sertraline 25 MG and adjust dose as needed + Buspar to take as needed for severe anxiety while transitioning to daily Sertraline.  Denies SI/HI.  Return in 6 months.       Vitamin D deficiency    Ongoing, continue supplement and adjust as needed. Vit D check next visit.         Follow up plan: Return in about 5 months (around 06/25/2021) for Annual physical.

## 2021-01-30 NOTE — Assessment & Plan Note (Signed)
Ongoing, continue supplement and adjust as needed. Vit D check next visit.

## 2021-01-30 NOTE — Assessment & Plan Note (Signed)
Chronic, stable. Continue Sertraline 25 MG and adjust dose as needed + Buspar to take as needed for severe anxiety while transitioning to daily Sertraline.  Denies SI/HI.  Return in 6 months.

## 2021-01-30 NOTE — Patient Instructions (Signed)
http://APA.org/depression-guideline"> https://clinicalkey.com"> http://point-of-care.elsevierperformancemanager.com/skills/"> http://point-of-care.elsevierperformancemanager.com">  Managing Depression, Adult Depression is a mental health condition that affects your thoughts, feelings, and actions. Being diagnosed with depression can bring you relief if you did not know why you have felt or behaved a certain way. It could also leave you feeling overwhelmed with uncertainty about your future. Preparing yourself tomanage your symptoms can help you feel more positive about your future. How to manage lifestyle changes Managing stress  Stress is your body's reaction to life changes and events, both good and bad. Stress can add to your feelings of depression. Learning to manage your stresscan help lessen your feelings of depression. Try some of the following approaches to reducing your stress (stress reduction techniques): Listen to music that you enjoy and that inspires you. Try using a meditation app or take a meditation class. Develop a practice that helps you connect with your spiritual self. Walk in nature, pray, or go to a place of worship. Do some deep breathing. To do this, inhale slowly through your nose. Pause at the top of your inhale for a few seconds and then exhale slowly, letting your muscles relax. Practice yoga to help relax and work your muscles. Choose a stress reduction technique that suits your lifestyle and personality. These techniques take time and practice to develop. Set aside 5-15 minutes a day to do them. Therapists can offer training in these techniques. Other things you can do to manage stress include: Keeping a stress diary. Knowing your limits and saying no when you think something is too much. Paying attention to how you react to certain situations. You may not be able to control everything, but you can change your reaction. Adding humor to your life by watching funny films  or TV shows. Making time for activities that you enjoy and that relax you.  Medicines Medicines, such as antidepressants, are often a part of treatment for depression. Talk with your pharmacist or health care provider about all the medicines, supplements, and herbal products that you take, their possible side effects, and what medicines and other products are safe to take together. Make sure to report any side effects you may have to your health care provider. Relationships Your health care provider may suggest family therapy, couples therapy, orindividual therapy as part of your treatment. How to recognize changes Everyone responds differently to treatment for depression. As you recover from depression, you may start to: Have more interest in doing activities. Feel less hopeless. Have more energy. Overeat less often, or have a better appetite. Have better mental focus. It is important to recognize if your depression is not getting better or is getting worse. The symptoms you had in the beginning may return, such as: Tiredness (fatigue) or low energy. Eating too much or too little. Sleeping too much or too little. Feeling restless, agitated, or hopeless. Trouble focusing or making decisions. Unexplained physical complaints. Feeling irritable, angry, or aggressive. If you or your family members notice these symptoms coming back, let yourhealth care provider know right away. Follow these instructions at home: Activity  Try to get some form of exercise each day, such as walking, biking, swimming, or lifting weights. Practice stress reduction techniques. Engage your mind by taking a class or doing some volunteer work.  Lifestyle Get the right amount and quality of sleep. Cut down on using caffeine, tobacco, alcohol, and other potentially harmful substances. Eat a healthy diet that includes plenty of vegetables, fruits, whole grains, low-fat dairy products, and lean protein. Do not   eat  a lot of foods that are high in solid fats, added sugars, or salt (sodium). General instructions Take over-the-counter and prescription medicines only as told by your health care provider. Keep all follow-up visits as told by your health care provider. This is important. Where to find support Talking to others  Friends and family members can be sources of support and guidance. Talk to trusted friends or family members about your condition. Explain your symptoms to them, and let them know that you are working with a health care provider to treat your depression. Tell friends and family members how they also can behelpful. Finances Find appropriate mental health providers that fit with your financial situation. Talk with your health care provider about options to get reduced prices on your medicines. Where to find more information You can find support in your area from: Anxiety and Depression Association of America (ADAA): www.adaa.org Mental Health America: www.mentalhealthamerica.net National Alliance on Mental Illness: www.nami.org Contact a health care provider if: You stop taking your antidepressant medicines, and you have any of these symptoms: Nausea. Headache. Light-headedness. Chills and body aches. Not being able to sleep (insomnia). You or your friends and family think your depression is getting worse. Get help right away if: You have thoughts of hurting yourself or others. If you ever feel like you may hurt yourself or others, or have thoughts about taking your own life, get help right away. Go to your nearest emergency department or: Call your local emergency services (911 in the [place] Call a suicide crisis helpline, such as the National Suicide Prevention Lifeline at 1-800-273-8255. This is open 24 hours a day in the [place] Text the Crisis Text Line at 741741 (in the [place] Summary If you are diagnosed with depression, preparing yourself to manage your symptoms is a good way  to feel positive about your future. Work with your health care provider on a management plan that includes stress reduction techniques, medicines (if applicable), therapy, and healthy lifestyle habits. Keep talking with your health care provider about how your treatment is working. If you have thoughts about taking your own life, call a suicide crisis helpline or text a crisis text line. This information is not intended to replace advice given to you by your health care provider. Make sure you discuss any questions you have with your healthcare provider. Document Revised: 06/13/2019 Document Reviewed: 06/13/2019 Elsevier Patient Education  2022 Elsevier Inc.  

## 2021-01-30 NOTE — Assessment & Plan Note (Signed)
Ongoing.  History of level <300 and reports neuropathy discomfort.  Recheck labs at physical.  Recommend continue Vitamin B12 orally 1000 MCG daily.

## 2021-03-10 DIAGNOSIS — Z1231 Encounter for screening mammogram for malignant neoplasm of breast: Secondary | ICD-10-CM | POA: Diagnosis not present

## 2021-03-24 ENCOUNTER — Other Ambulatory Visit: Payer: Self-pay

## 2021-03-24 ENCOUNTER — Ambulatory Visit (INDEPENDENT_AMBULATORY_CARE_PROVIDER_SITE_OTHER): Payer: BC Managed Care – PPO

## 2021-03-24 DIAGNOSIS — Z3042 Encounter for surveillance of injectable contraceptive: Secondary | ICD-10-CM | POA: Diagnosis not present

## 2021-03-24 MED ORDER — MEDROXYPROGESTERONE ACETATE 150 MG/ML IM SUSP
150.0000 mg | Freq: Once | INTRAMUSCULAR | Status: AC
  Administered 2021-03-24: 150 mg via INTRAMUSCULAR

## 2021-05-30 ENCOUNTER — Other Ambulatory Visit: Payer: Self-pay | Admitting: Nurse Practitioner

## 2021-05-30 NOTE — Telephone Encounter (Signed)
Requested Prescriptions  Pending Prescriptions Disp Refills  . triamcinolone cream (KENALOG) 0.1 % [Pharmacy Med Name: TRIAMCINOLONE 0.1% CREAM] 30 g 0    Sig: APPLY TO AFFECTED AREA TWICE A DAY     Dermatology:  Corticosteroids Passed - 05/30/2021 10:26 AM      Passed - Valid encounter within last 12 months    Recent Outpatient Visits          4 months ago Benign hypertension   [place], Barbaraann Faster, NP   8 months ago Rectal bleed   [place], Barbaraann Faster, NP   11 months ago Encounter for annual physical exam   [place], Henrine Screws T, NP   1 year ago Decrease in appetite   [place], Henrine Screws T, NP   1 year ago Generalized anxiety disorder   [place], Barbaraann Faster, NP      Future Appointments            In 1 month Cann[place], Barbaraann Faster, NP [facility]

## 2021-06-05 ENCOUNTER — Encounter: Payer: Self-pay | Admitting: Gastroenterology

## 2021-06-05 ENCOUNTER — Ambulatory Visit (INDEPENDENT_AMBULATORY_CARE_PROVIDER_SITE_OTHER): Payer: BC Managed Care – PPO | Admitting: Gastroenterology

## 2021-06-05 ENCOUNTER — Other Ambulatory Visit: Payer: Self-pay

## 2021-06-05 VITALS — BP 160/90 | HR 97 | Temp 97.9°F | Ht 63.0 in | Wt 123.8 lb

## 2021-06-05 DIAGNOSIS — K641 Second degree hemorrhoids: Secondary | ICD-10-CM | POA: Diagnosis not present

## 2021-06-05 NOTE — Progress Notes (Signed)

## 2021-06-05 NOTE — Progress Notes (Signed)
Cephas Darby, MD [address]  [place]  [place], [place] [address]  Main: [phone]  Fax: [phone]    Gastroenterology Consultation  Referring Provider:     Venita Lick, NP Primary Care Physician:  Venita Lick, NP Primary Gastroenterologist:  Dr. Cephas Darby Reason for Consultation:     Symptomatic hemorrhoids        HPI:   Wanda Hudson is a 50 y.o. female referred by Dr. Venita Lick, NP  for consultation & management of symptomatic hemorrhoids.  Patient reports that for last 4 months she has been experiencing rectal bleeding, bright red blood per rectum dripping into the toilet and on wiping associated with rectal pressure, swelling as well as mucousy discharge.  Patient denies constipation, diarrhea.  Sometimes she has sensation of incomplete emptying.  She denies any significant straining.  She does have prolapse of the hemorrhoid that spontaneously reduce after bowel movement.  NSAIDs: None  Antiplts/Anticoagulants/Anti thrombotics: None  GI Procedures:  Colonoscopy in 2020 - One 4 mm polyp in the ascending colon, removed with a cold snare. Resected and retrieved. - Two 3 to 4 mm polyps in the descending colon, removed with a cold snare. Resected and retrieved. - Non-bleeding internal hemorrhoids.  DIAGNOSIS:  A.  COLON POLYP, ASCENDING; COLD SNARE:  - HYPERPLASTIC POLYP.  - NEGATIVE FOR DYSPLASIA AND MALIGNANCY.   B.  COLON POLYPS X 2, DESCENDING; COLD SNARE:  - HYPERPLASTIC POLYPS, 2 FRAGMENTS.  - NEGATIVE FOR DYSPLASIA AND MALIGNANCY.   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     COLONOSCOPY WITH PROPOFOL N/A 06/29/2019   Procedure: COLONOSCOPY WITH PROPOFOL;   Surgeon: Lucilla Lame, MD;  Location[facility] ENDOSCOPY;  Service: Endoscopy;  Laterality: N/A;   DG SALIVARY GLANDS ([facility] HX)     TUMOR REMOVAL     ears    Current Outpatient Medications:    Alpha-Lipoic Acid 100 MG TABS, Take 1 tablet by mouth daily., Disp: , Rfl:    amLODipine (NORVASC) 5 MG tablet, Take 1 tablet (5 mg total) by mouth daily., Disp: 90 tablet, Rfl: 4   Aspirin-Salicylamide-Caffeine [id number] MG PACK, Take by mouth as needed., Disp: , Rfl:    busPIRone (BUSPAR) 5 MG tablet, TAKE 1 TABLET BY MOUTH TWICE A DAY AS NEEDED---NEED OFFICE VISIT, Disp: 180 tablet, Rfl: 4   Cyanocobalamin (B-12) 1000 MCG CAPS, B12, Disp: , Rfl:    Evening Primrose Oil 500 MG CAPS, Take 500 mg by mouth daily., Disp: , Rfl:    sertraline (ZOLOFT) 25 MG tablet, TAKE 1 TABLET BY MOUTH EVERY DAY, Disp: 90 tablet, Rfl: 4   triamcinolone cream (KENALOG) 0.1 %, APPLY TO AFFECTED AREA TWICE A DAY, Disp: 30 g, Rfl: 0   Vitamin D, Cholecalciferol, 1000 UNITS TABS, Take 1,000 Units by mouth daily., Disp: , Rfl:     Family History  Problem Relation Age of Onset   Diabetes Mother    Hypertension Mother    Stroke Mother    Diabetes Father    Hypertension Father    Lung cancer Father    Heart disease Maternal Grandfather  MI   Heart disease Paternal Grandfather        MI   Hypertension Brother    Aneurysm Maternal Grandmother    Breast cancer Neg Hx      Social History   Tobacco Use   Smoking status: Some Days    Packs/day: 0.50    Years: 21.00    Pack years: 10.50    Types: Cigarettes   Smokeless tobacco: Never  Vaping Use   Vaping Use: Never used  Substance Use Topics   Alcohol use: No    Comment: occasionally   Drug use: No    Allergies as of 06/05/2021 - Review Complete 06/05/2021  Allergen Reaction Noted   Codeine Rash 06/12/2013    Review of Systems:    All systems reviewed and negative except where noted in HPI.   Physical Exam:  BP (!) 160/90   Pulse 97   Temp  97.9 F (36.6 C) (Temporal)   Ht 5\' 3"  (1.6 m)   Wt 123 lb 12.8 oz (56.2 kg)   BMI 21.93 kg/m  No LMP recorded. Patient has had an injection.  General:   Alert,  Well-developed, well-nourished, pleasant and cooperative in NAD Head:  Normocephalic and atraumatic. Eyes:  Sclera clear, no icterus.   Conjunctiva pink. Ears:  Normal auditory acuity. Nose:  No deformity, discharge, or lesions. Mouth:  No deformity or lesions,oropharynx pink & moist. Neck:  Supple; no masses or thyromegaly. Lungs:  Respirations even and unlabored.  Clear throughout to auscultation.   No wheezes, crackles, or rhonchi. No acute distress. Heart:  Regular rate and rhythm; no murmurs, clicks, rubs, or gallops. Abdomen:  Normal bowel sounds. Soft, non-tender and non-distended without masses, hepatosplenomegaly or hernias noted.  No guarding or rebound tenderness.   Rectal: Medium size perianal skin tag, nontender digital rectal exam, palpable hemorrhoids Msk:  Symmetrical without gross deformities. Good, equal movement & strength bilaterally. Pulses:  Normal pulses noted. Extremities:  No clubbing or edema.  No cyanosis. Neurologic:  Alert and oriented x3;  grossly normal neurologically. Skin:  Intact without significant lesions or rashes. No jaundice. Psych:  Alert and cooperative. Normal mood and affect.  Imaging Studies: No abdominal imaging  Assessment and Plan:   Wanda Hudson is a 50 y.o. pleasant African-American female with 4 months history of symptomatic grade 2 hemorrhoids  I discussed regarding outpatient hemorrhoid ligation including risks and benefits and patient is willing to undergo hemorrhoid ligation today Consent obtained   Follow up in 2 weeks   Cephas Darby, MD

## 2021-06-09 ENCOUNTER — Other Ambulatory Visit: Payer: Self-pay

## 2021-06-09 ENCOUNTER — Ambulatory Visit (INDEPENDENT_AMBULATORY_CARE_PROVIDER_SITE_OTHER): Payer: BC Managed Care – PPO

## 2021-06-09 DIAGNOSIS — Z23 Encounter for immunization: Secondary | ICD-10-CM

## 2021-06-09 DIAGNOSIS — Z3042 Encounter for surveillance of injectable contraceptive: Secondary | ICD-10-CM

## 2021-06-09 MED ORDER — MEDROXYPROGESTERONE ACETATE 150 MG/ML IM SUSP
150.0000 mg | Freq: Once | INTRAMUSCULAR | Status: AC
  Administered 2021-06-09: 150 mg via INTRAMUSCULAR

## 2021-06-09 NOTE — Progress Notes (Signed)
Pt arrived for Depo Provera injection.  Pt tolerated injection well in left hip. Last Depo Provera Given - 04/10/2021 Pt is within due dates. Pt should return between Jan 10 and Jan 24

## 2021-06-12 ENCOUNTER — Other Ambulatory Visit: Payer: Self-pay | Admitting: Nurse Practitioner

## 2021-06-13 NOTE — Telephone Encounter (Signed)
Last RF 06/11/20 #90 4 RF+ 15 month prescription- pt has [facility].

## 2021-06-13 NOTE — Telephone Encounter (Signed)
Pt should have ample supply. LRF 06/11/20  #90  4 refills

## 2021-06-14 ENCOUNTER — Other Ambulatory Visit: Payer: Self-pay | Admitting: Nurse Practitioner

## 2021-06-14 NOTE — Telephone Encounter (Signed)
Requested Prescriptions  Pending Prescriptions Disp Refills   sertraline (ZOLOFT) 25 MG tablet [Pharmacy Med Name: SERTRALINE HCL 25 MG TABLET] 60 tablet 0    Sig: TAKE 1 TABLET BY MOUTH EVERY DAY     Psychiatry:  Antidepressants - SSRI Passed - 06/14/2021  5:54 PM      Passed - Valid encounter within last 6 months    Recent Outpatient Visits           4 months ago Benign hypertension   [place], Henrine Screws T, NP   9 months ago Rectal bleed   [place], Henrine Screws T, NP   11 months ago Encounter for annual physical exam   [place], Henrine Screws T, NP   1 year ago Decrease in appetite   [place], McKin[place] T, NP   1 year ago Generalized anxiety disorder   [place], Barbaraann Faster, NP       Future Appointments             In 1 week [place], Tally Due, MD [facility]   In 3 weeks Cannady, Barbaraann Faster, NP [facility]

## 2021-06-16 NOTE — Telephone Encounter (Signed)
Just an fyil.  Please advise if needed.

## 2021-06-26 ENCOUNTER — Ambulatory Visit: Payer: BC Managed Care – PPO | Admitting: Gastroenterology

## 2021-07-07 ENCOUNTER — Encounter: Payer: BC Managed Care – PPO | Admitting: Nurse Practitioner

## 2021-07-07 DIAGNOSIS — E7800 Pure hypercholesterolemia, unspecified: Secondary | ICD-10-CM

## 2021-07-07 DIAGNOSIS — E559 Vitamin D deficiency, unspecified: Secondary | ICD-10-CM

## 2021-07-07 DIAGNOSIS — Z0000 Encounter for general adult medical examination without abnormal findings: Secondary | ICD-10-CM

## 2021-07-07 DIAGNOSIS — R7301 Impaired fasting glucose: Secondary | ICD-10-CM

## 2021-07-07 DIAGNOSIS — M8580 Other specified disorders of bone density and structure, unspecified site: Secondary | ICD-10-CM

## 2021-07-07 DIAGNOSIS — E538 Deficiency of other specified B group vitamins: Secondary | ICD-10-CM

## 2021-07-07 DIAGNOSIS — I10 Essential (primary) hypertension: Secondary | ICD-10-CM

## 2021-08-25 ENCOUNTER — Ambulatory Visit: Payer: Self-pay

## 2021-08-27 ENCOUNTER — Other Ambulatory Visit: Payer: Self-pay

## 2021-08-27 ENCOUNTER — Ambulatory Visit: Payer: Self-pay

## 2021-09-14 ENCOUNTER — Other Ambulatory Visit: Payer: Self-pay

## 2021-09-14 ENCOUNTER — Ambulatory Visit (INDEPENDENT_AMBULATORY_CARE_PROVIDER_SITE_OTHER): Payer: BC Managed Care – PPO

## 2021-09-14 DIAGNOSIS — Z3042 Encounter for surveillance of injectable contraceptive: Secondary | ICD-10-CM | POA: Diagnosis not present

## 2021-09-14 LAB — PREGNANCY, URINE: Preg Test, Ur: NEGATIVE

## 2021-09-15 MED ORDER — MEDROXYPROGESTERONE ACETATE 150 MG/ML IM SUSP
150.0000 mg | Freq: Once | INTRAMUSCULAR | Status: AC
  Administered 2021-09-14: 150 mg via INTRAMUSCULAR

## 2021-09-15 NOTE — Progress Notes (Signed)
Administered depo shot today, patient tolerated well. Patient will be due for next dose April 17- May 1

## 2021-10-05 ENCOUNTER — Encounter: Payer: Self-pay | Admitting: Nurse Practitioner

## 2021-10-05 ENCOUNTER — Telehealth (INDEPENDENT_AMBULATORY_CARE_PROVIDER_SITE_OTHER): Payer: BC Managed Care – PPO | Admitting: Nurse Practitioner

## 2021-10-05 DIAGNOSIS — J302 Other seasonal allergic rhinitis: Secondary | ICD-10-CM

## 2021-10-05 MED ORDER — METHYLPREDNISOLONE 4 MG PO TBPK: ORAL_TABLET | ORAL | 0 refills | Status: DC

## 2021-10-05 MED ORDER — FLUTICASONE PROPIONATE 50 MCG/ACT NA SUSP: 2.0000 | Freq: Every day | NASAL | 6 refills | Status: AC

## 2021-10-05 NOTE — Progress Notes (Signed)
There were no vitals taken for this visit.   Subjective:    Patient ID: Wanda Hudson, female    DOB: [birth year], 51 y.o.   MRN: [id number]  HPI: [name] is a 51 y.o. female  Chief Complaint  Patient presents with   Allergies    Pt states she has been having issues with her sinuses. States she has been having a runny nose with clear phlegm, and sneezing. States the phlegm has blood it in occasionally. States she has tried [employer] OTC.    ALLERGIES Duration: weeks (3) Runny nose: yes "clear Nasal congestion: yes Nasal itching: yes Sneezing: yes Eye swelling, itching or discharge: yes Post nasal drip:  when she lays down at night Cough: yes Sinus pressure: yes  Ear pain: no bilateral Ear pressure: yes "right Fever: no  none Symptoms occur seasonally: yes Symptoms occur perenially: no Satisfied with current treatment: no Allergist evaluation in past: no Current allergy medications: allegra and claritin Treatments attempted: claritin  Relevant past medical, surgical, family and social history reviewed and updated as indicated. Interim medical history since our last visit reviewed. Allergies and medications reviewed and updated.  Review of Systems  Constitutional:  Negative for fever.  HENT:  Positive for congestion, postnasal drip, rhinorrhea, sinus pressure and sneezing. Negative for ear pain.   Eyes:  Positive for discharge.  Respiratory:  Positive for cough.    Per HPI unless specifically indicated above     Objective:    There were no vitals taken for this visit.  Wt Readings from Last 3 Encounters:  06/05/21 123 lb 12.8 oz (56.2 kg)  01/30/21 126 lb 12.8 oz (57.5 kg)  10/28/20 125 lb (56.7 kg)    Physical Exam Vitals and nursing note reviewed.  HENT:     Head: Normocephalic.     Right Ear: Hearing normal.     Left Ear: Hearing normal.     Nose: Nose normal.  Eyes:     Pupils: Pupils are equal, round, and reactive to light.   Pulmonary:     Effort: Pulmonary effort is normal. No respiratory distress.  Neurological:     Mental Status: She is alert.  Psychiatric:        Mood and Affect: Mood normal.        Behavior: Behavior normal.        Thought Content: Thought content normal.        Judgment: Judgment normal.    Results for orders placed or performed in visit on 09/14/21  Pregnancy, urine  Result Value Ref Range   Preg Test, Ur Negative Negative      Assessment & Plan:   Problem List Items Addressed This Visit   None Visit Diagnoses     Seasonal allergic rhinitis, unspecified trigger    -  Primary   Continue with Allegra. Recommend using neti pot. Will give medrol dose pak and flonase to help with symptoms. FU if symptoms worsen or fial to improve.         Follow up plan: Return if symptoms worsen or fail to improve.   This visit was completed via MyChart due to the restrictions of the COVID-19 pandemic. All issues as above were discussed and addressed. Physical exam was done as above through visual confirmation on MyChart. If it was felt that the patient should be evaluated in the office, they were directed there. The patient verbally consented to this visit. Location of the patient: Home Location  of the provider: Office Those involved with this call:  Provider: Jon Billings, NP CMA: Yvonna Alanis, [place] Desk/Registration: Myrlene Broker This encounter was conducted via video.  I spent 20 dedicated to the care of this patient on the date of this encounter to include previsit review of treatment plan for seasonal allergies and review of symptoms, face to face time with the patient, and post visit ordering of testing.

## 2021-10-15 ENCOUNTER — Other Ambulatory Visit: Payer: Self-pay | Admitting: Nurse Practitioner

## 2021-10-15 NOTE — Telephone Encounter (Signed)
Discontinued 10/05/21. Not on current med profile ? ?Requested Prescriptions  ?Pending Prescriptions Disp Refills  ?? triamcinolone cream (KENALOG) 0.1 % [Pharmacy Med Name: TRIAMCINOLONE 0.1% CREAM] 30 g 0  ?  Sig: APPLY TO AFFECTED AREA TWICE A DAY  ?  ? Not Delegated - Dermatology:  Corticosteroids Failed - 10/15/2021 10:59 AM  ?  ?  Failed - This refill cannot be delegated  ?  ?  Passed - Valid encounter within last 12 months  ?  Recent Outpatient Visits   ?      ? 1 week ago Seasonal allergic rhinitis, unspecified trigger  ? [place], NP  ? 8 months ago Benign hypertension  ? [place], Henrine Screws T, NP  ? 1 year ago Rectal bleed  ? [place], Henrine Screws T, NP  ? 1 year ago Encounter for annual physical exam  ? [place], [place] T, NP  ? 1 year ago Decrease in appetite  ? [facility] [place], Henrine Screws T, NP  ?  ?  ? ?  ?  ?  ? ?

## 2021-11-02 ENCOUNTER — Ambulatory Visit: Payer: BC Managed Care – PPO | Admitting: Nurse Practitioner

## 2021-12-07 ENCOUNTER — Ambulatory Visit: Payer: BC Managed Care – PPO

## 2021-12-07 ENCOUNTER — Ambulatory Visit (INDEPENDENT_AMBULATORY_CARE_PROVIDER_SITE_OTHER): Payer: BC Managed Care – PPO

## 2021-12-07 DIAGNOSIS — Z3042 Encounter for surveillance of injectable contraceptive: Secondary | ICD-10-CM | POA: Diagnosis not present

## 2021-12-07 MED ORDER — MEDROXYPROGESTERONE ACETATE 150 MG/ML IM SUSP
150.0000 mg | Freq: Once | INTRAMUSCULAR | Status: AC
  Administered 2021-12-07: 150 mg via INTRAMUSCULAR

## 2022-02-20 ENCOUNTER — Other Ambulatory Visit: Payer: Self-pay | Admitting: Nurse Practitioner

## 2022-02-22 ENCOUNTER — Ambulatory Visit (INDEPENDENT_AMBULATORY_CARE_PROVIDER_SITE_OTHER): Payer: BC Managed Care – PPO

## 2022-02-22 DIAGNOSIS — Z3042 Encounter for surveillance of injectable contraceptive: Secondary | ICD-10-CM | POA: Diagnosis not present

## 2022-02-22 MED ORDER — MEDROXYPROGESTERONE ACETATE 150 MG/ML IM SUSP
150.0000 mg | Freq: Once | INTRAMUSCULAR | Status: AC
  Administered 2022-02-22: 150 mg via INTRAMUSCULAR

## 2022-02-22 NOTE — Telephone Encounter (Signed)
Requested medication (s) are due for refill today: Yes  Requested medication (s) are on the active medication list: Yes  Last refill:  01/30/21  Future visit scheduled: No  Notes to clinic:  Prescription has expired.    Requested Prescriptions  Pending Prescriptions Disp Refills   amLODipine (NORVASC) 5 MG tablet [Pharmacy Med Name: AMLODIPINE BESYLATE 5 MG TAB] 90 tablet 4    Sig: Take 1 tablet (5 mg total) by mouth daily.     Cardiovascular: Calcium Channel Blockers 2 Failed - 02/20/2022 11:09 AM      Failed - Last BP in normal range    BP Readings from Last 1 Encounters:  06/05/21 (!) 160/90         Passed - Last Heart Rate in normal range    Pulse Readings from Last 1 Encounters:  06/05/21 97         Passed - Valid encounter within last 6 months    Recent Outpatient Visits           4 months ago Seasonal allergic rhinitis, unspecified trigger   [facility] Jon Billings, NP   1 year ago Benign hypertension   [place], Barbaraann Faster, NP   1 year ago Rectal bleed   [place], Henrine Screws T, NP   1 year ago Encounter for annual physical exam   [facility] [place], Henrine Screws T, NP   2 years ago Decrease in appetite   [place], Barbaraann Faster, NP

## 2022-02-23 NOTE — Telephone Encounter (Signed)
Pt scheduled 7/17

## 2022-02-27 NOTE — Patient Instructions (Incomplete)
Please call to schedule your mammogram and/or bone density: [facility]  Address: [address], [place], [place] [address] Phone: [phone]   DASH Eating Plan DASH stands for Dietary Approaches to Stop Hypertension. The DASH eating plan is a healthy eating plan that has been shown to: Reduce high blood pressure (hypertension). Reduce your risk for type 2 diabetes, heart disease, and stroke. Help with weight loss. What are tips for following this plan? Reading food labels Check food labels for the amount of salt (sodium) per serving. Choose foods with less than 5 percent of the Daily Value of sodium. Generally, foods with less than 300 milligrams (mg) of sodium per serving fit into this eating plan. To find whole grains, look for the word "whole" as the first word in the ingredient list. Shopping Buy products labeled as "low-sodium" or "no salt added." Buy fresh foods. Avoid canned foods and pre-made or frozen meals. Cooking Avoid adding salt when cooking. Use salt-free seasonings or herbs instead of table salt or sea salt. Check with your health care provider or pharmacist before using salt substitutes. Do not fry foods. Cook foods using healthy methods such as baking, boiling, grilling, roasting, and broiling instead. Cook with heart-healthy oils, such as olive, canola, avocado, soybean, or sunflower oil. Meal planning  Eat a balanced diet that includes: 4 or more servings of fruits and 4 or more servings of vegetables each day. Try to fill one-half of your plate with fruits and vegetables. 6-8 servings of whole grains each day. Less than 6 oz (170 g) of lean meat, poultry, or fish each day. A 3-oz (85-g) serving of meat is about the same size as a deck of cards. One egg equals 1 oz (28 g). 2-3 servings of low-fat dairy each day. One serving is 1 cup (237 mL). 1 serving of nuts, seeds, or beans 5 times each week. 2-3 servings of  heart-healthy fats. Healthy fats called omega-3 fatty acids are found in foods such as walnuts, flaxseeds, fortified milks, and eggs. These fats are also found in cold-water fish, such as sardines, salmon, and mackerel. Limit how much you eat of: Canned or prepackaged foods. Food that is high in trans fat, such as some fried foods. Food that is high in saturated fat, such as fatty meat. Desserts and other sweets, sugary drinks, and other foods with added sugar. Full-fat dairy products. Do not salt foods before eating. Do not eat more than 4 egg yolks a week. Try to eat at least 2 vegetarian meals a week. Eat more home-cooked food and less restaurant, buffet, and fast food. Lifestyle When eating at a restaurant, ask that your food be prepared with less salt or no salt, if possible. If you drink alcohol: Limit how much you use to: 0-1 drink a day for women who are not pregnant. 0-2 drinks a day for men. Be aware of how much alcohol is in your drink. In the [place] one drink equals one 12 oz bottle of beer (355 mL), one 5 oz glass of wine (148 mL), or one 1 oz glass of hard liquor (44 mL). General information Avoid eating more than 2,300 mg of salt a day. If you have hypertension, you may need to reduce your sodium intake to 1,500 mg a day. Work with your health care provider to maintain a healthy body weight or to lose weight. Ask what an ideal weight is for you. Get at least 30 minutes of   exercise that causes your heart to beat faster (aerobic exercise) most days of the week. Activities may include walking, swimming, or biking. Work with your health care provider or dietitian to adjust your eating plan to your individual calorie needs. What foods should I eat? Fruits All fresh, dried, or frozen fruit. Canned fruit in natural juice (without added sugar). Vegetables Fresh or frozen vegetables (raw, steamed, roasted, or grilled). Low-sodium or reduced-sodium tomato and vegetable juice.  Low-sodium or reduced-sodium tomato sauce and tomato paste. Low-sodium or reduced-sodium canned vegetables. Grains Whole-grain or whole-wheat bread. Whole-grain or whole-wheat pasta. Brown rice. Oatmeal. Quinoa. Bulgur. Whole-grain and low-sodium cereals. Pita bread. Low-fat, low-sodium crackers. Whole-wheat flour tortillas. Meats and other proteins Skinless chicken or turkey. Ground chicken or turkey. Pork with fat trimmed off. Fish and seafood. Egg whites. Dried beans, peas, or lentils. Unsalted nuts, nut butters, and seeds. Unsalted canned beans. Lean cuts of beef with fat trimmed off. Low-sodium, lean precooked or cured meat, such as sausages or meat loaves. Dairy Low-fat (1%) or fat-free (skim) milk. Reduced-fat, low-fat, or fat-free cheeses. Nonfat, low-sodium ricotta or cottage cheese. Low-fat or nonfat yogurt. Low-fat, low-sodium cheese. Fats and oils Soft margarine without trans fats. Vegetable oil. Reduced-fat, low-fat, or light mayonnaise and salad dressings (reduced-sodium). Canola, safflower, olive, avocado, soybean, and sunflower oils. Avocado. Seasonings and condiments Herbs. Spices. Seasoning mixes without salt. Other foods Unsalted popcorn and pretzels. Fat-free sweets. The items listed above may not be a complete list of foods and beverages you can eat. Contact a dietitian for more information. What foods should I avoid? Fruits Canned fruit in a light or heavy syrup. Fried fruit. Fruit in cream or butter sauce. Vegetables Creamed or fried vegetables. Vegetables in a cheese sauce. Regular canned vegetables (not low-sodium or reduced-sodium). Regular canned tomato sauce and paste (not low-sodium or reduced-sodium). Regular tomato and vegetable juice (not low-sodium or reduced-sodium). Pickles. Olives. Grains Baked goods made with fat, such as croissants, muffins, or some breads. Dry pasta or rice meal packs. Meats and other proteins Fatty cuts of meat. Ribs. Fried meat. Bacon.  Bologna, salami, and other precooked or cured meats, such as sausages or meat loaves. Fat from the back of a pig (fatback). Bratwurst. Salted nuts and seeds. Canned beans with added salt. Canned or smoked fish. Whole eggs or egg yolks. Chicken or turkey with skin. Dairy Whole or 2% milk, cream, and half-and-half. Whole or full-fat cream cheese. Whole-fat or sweetened yogurt. Full-fat cheese. Nondairy creamers. Whipped toppings. Processed cheese and cheese spreads. Fats and oils Butter. Stick margarine. Lard. Shortening. Ghee. Bacon fat. Tropical oils, such as coconut, palm kernel, or palm oil. Seasonings and condiments Onion salt, garlic salt, seasoned salt, table salt, and sea salt. Worcestershire sauce. Tartar sauce. Barbecue sauce. Teriyaki sauce. Soy sauce, including reduced-sodium. Steak sauce. Canned and packaged gravies. Fish sauce. Oyster sauce. Cocktail sauce. Store-bought horseradish. Ketchup. Mustard. Meat flavorings and tenderizers. Bouillon cubes. Hot sauces. Pre-made or packaged marinades. Pre-made or packaged taco seasonings. Relishes. Regular salad dressings. Other foods Salted popcorn and pretzels. The items listed above may not be a complete list of foods and beverages you should avoid. Contact a dietitian for more information. Where to find more information National Heart, Lung, and Blood Institute: www.nhlbi.nih.gov American Heart Association: www.heart.org Academy of Nutrition and Dietetics: www.eatright.org National Kidney Foundation: www.kidney.org Summary The DASH eating plan is a healthy eating plan that has been shown to reduce high blood pressure (hypertension). It may also reduce your risk for type 2   diabetes, heart disease, and stroke. When on the DASH eating plan, aim to eat more fresh fruits and vegetables, whole grains, lean proteins, low-fat dairy, and heart-healthy fats. With the DASH eating plan, you should limit salt (sodium) intake to 2,300 mg a day. If you have  hypertension, you may need to reduce your sodium intake to 1,500 mg a day. Work with your health care provider or dietitian to adjust your eating plan to your individual calorie needs. This information is not intended to replace advice given to you by your health care provider. Make sure you discuss any questions you have with your health care provider. Document Revised: 07/06/2019 Document Reviewed: 07/06/2019 Elsevier Patient Education  2023 Elsevier Inc.  

## 2022-03-01 ENCOUNTER — Ambulatory Visit: Payer: BC Managed Care – PPO | Admitting: Nurse Practitioner

## 2022-03-01 ENCOUNTER — Encounter: Payer: Self-pay | Admitting: Nurse Practitioner

## 2022-03-01 VITALS — BP 117/75 | HR 90 | Temp 98.0°F | Ht 63.0 in | Wt 122.6 lb

## 2022-03-01 DIAGNOSIS — R748 Abnormal levels of other serum enzymes: Secondary | ICD-10-CM

## 2022-03-01 DIAGNOSIS — F411 Generalized anxiety disorder: Secondary | ICD-10-CM

## 2022-03-01 DIAGNOSIS — I10 Essential (primary) hypertension: Secondary | ICD-10-CM | POA: Diagnosis not present

## 2022-03-01 DIAGNOSIS — Z1231 Encounter for screening mammogram for malignant neoplasm of breast: Secondary | ICD-10-CM

## 2022-03-01 DIAGNOSIS — E7800 Pure hypercholesterolemia, unspecified: Secondary | ICD-10-CM | POA: Diagnosis not present

## 2022-03-01 DIAGNOSIS — E559 Vitamin D deficiency, unspecified: Secondary | ICD-10-CM | POA: Diagnosis not present

## 2022-03-01 DIAGNOSIS — F17210 Nicotine dependence, cigarettes, uncomplicated: Secondary | ICD-10-CM

## 2022-03-01 DIAGNOSIS — E538 Deficiency of other specified B group vitamins: Secondary | ICD-10-CM | POA: Diagnosis not present

## 2022-03-01 DIAGNOSIS — M8580 Other specified disorders of bone density and structure, unspecified site: Secondary | ICD-10-CM | POA: Diagnosis not present

## 2022-03-01 MED ORDER — VARENICLINE TARTRATE 0.5 MG X 11 & 1 MG X 42 PO TBPK: ORAL_TABLET | ORAL | 0 refills | Status: DC

## 2022-03-01 MED ORDER — VARENICLINE TARTRATE 0.5 MG X 11 & 1 MG X 42 PO TBPK
ORAL_TABLET | ORAL | 0 refills | Status: DC
Start: 2022-03-01 — End: 2022-03-01

## 2022-03-01 MED ORDER — AMLODIPINE BESYLATE 5 MG PO TABS: 5.0000 mg | ORAL_TABLET | Freq: Every day | ORAL | 4 refills | Status: DC

## 2022-03-01 NOTE — Assessment & Plan Note (Signed)
Ongoing for years since age 51.  She wishes to try Chantix again, will send this in.  Referral to lung screening placed after discussion with patient, she wishes to pursue.

## 2022-03-01 NOTE — Progress Notes (Signed)
BP 117/75   Pulse 90   Temp 98 F (36.7 C) (Oral)   Ht '5\' 3"'  (1.6 m)   Wt 122 lb 9.6 oz (55.6 kg)   SpO2 99%   BMI 21.72 kg/m    Subjective:    Patient ID: [name], female    DOB: June 21, 1971, 51 y.o.   MRN: [id number]  HPI: GLORIMAR STROOPE is a 51 y.o. female  Chief Complaint  Patient presents with   Hypertension   Depression    Patient says she has stopped the medication for Depression. Patient says she feel worse and she thinks because she may have been on the medication too long.    Anxiety   Vitamin B12   HYPERTENSION / HYPERLIPIDEMIA Continues on Amlodipine daily.  No statins.  Continues to smoke about 1/2 PPD, has smoked since she was 61.  She has tried multiple things to quit, Chantix has offered her benefit in past.  History of elevated Alk Phos on labs. Satisfied with current treatment? yes Duration of hypertension: chronic BP monitoring frequency: a few times a week BP range: 110/70's BP medication side effects: no Duration of hyperlipidemia: chronic Aspirin: no Recent stressors: no Recurrent headaches: no Visual changes: no Palpitations: no Dyspnea: no Chest pain: no Lower extremity edema: no Dizzy/lightheaded: no   ANXIETY/STRESS Stopped taking Sertraline and Buspar about 4 months ago and reports feeling better without them.  Feels better without out. Duration:controlled Anxious mood: no  Excessive worrying: no Irritability: no  Sweating: no Nausea: no Palpitations:no Hyperventilation: no Panic attacks: no Agoraphobia: no  Obscessions/compulsions: no Depressed mood: no    03/04/2022    1:30 PM 01/30/2021    3:02 PM 09/04/2020    8:02 AM 06/23/2020    8:20 AM 12/31/2019    8:25 AM  Depression screen PHQ 2/9  Decreased Interest 0 0 0 1 0  Down, Depressed, Hopeless 0 0 0 2 0  PHQ - 2 Score 0 0 0 3 0  Altered sleeping 0  0 1 0  Tired, decreased energy 0  0 1 0  Change in appetite 0  0 0 1  Feeling bad or failure about yourself  0   0 0 0  Trouble concentrating 0  0 0 0  Moving slowly or fidgety/restless 0  0 0 0  Suicidal thoughts 0  0 0 0  PHQ-9 Score 0  0 5 1  Difficult doing work/chores Not difficult at all   Somewhat difficult Not difficult at all  Anhedonia: no Weight changes: no Insomnia: none Hypersomnia: no Fatigue/loss of energy: no Feelings of worthlessness: no Feelings of guilt: no Impaired concentration/indecisiveness: no Suicidal ideations: no  Crying spells: no Recent Stressors/Life Changes: no   Relationship problems: no   Family stress: no     Financial stress: no    Job stress: no    Recent death/loss: no     03-04-2022    1:30 PM 07/02/2019    1:58 PM 06/22/2019    8:19 AM  GAD 7 : Generalized Anxiety Score  Nervous, Anxious, on Edge 0 0 2  Control/stop worrying 0 0 2  Worry too much - different things 0 0 2  Trouble relaxing 0 0 1  Restless 0 0 2  Easily annoyed or irritable 0 0 2  Afraid - awful might happen 0 0 2  Total GAD 7 Score 0 0 13  Anxiety Difficulty Not difficult at all Not difficult at all Somewhat difficult  OSTEOPENIA Continues on Vitamin D daily. No recent DEXA on file.  History of low B12 level with last check was 221 in 2020 -- still taking supplement at home. Adequate calcium & vitamin D: yes Weight bearing exercises: no   Relevant past medical, surgical, family and social history reviewed and updated as indicated. Interim medical history since our last visit reviewed. Allergies and medications reviewed and updated.  Review of Systems  Constitutional:  Negative for activity change, appetite change, diaphoresis, fatigue and fever.  Respiratory:  Negative for cough, chest tightness and shortness of breath.   Cardiovascular:  Negative for chest pain, palpitations and leg swelling.  Gastrointestinal: Negative.   Neurological: Negative.   Psychiatric/Behavioral: Negative.      Per HPI unless specifically indicated above     Objective:    BP 117/75    Pulse 90   Temp 98 F (36.7 C) (Oral)   Ht '5\' 3"'  (1.6 m)   Wt 122 lb 9.6 oz (55.6 kg)   SpO2 99%   BMI 21.72 kg/m   Wt Readings from Last 3 Encounters:  03/01/22 122 lb 9.6 oz (55.6 kg)  06/05/21 123 lb 12.8 oz (56.2 kg)  01/30/21 126 lb 12.8 oz (57.5 kg)    Physical Exam Vitals and nursing note reviewed.  Constitutional:      General: She is awake. She is not in acute distress.    Appearance: She is well-developed and well-groomed. She is not ill-appearing or toxic-appearing.  HENT:     Head: Normocephalic.     Right Ear: Hearing normal.     Left Ear: Hearing normal.  Eyes:     General: Lids are normal.        Right eye: No discharge.        Left eye: No discharge.     Conjunctiva/sclera: Conjunctivae normal.     Pupils: Pupils are equal, round, and reactive to light.  Neck:     Thyroid: No thyromegaly.     Vascular: No carotid bruit or JVD.  Cardiovascular:     Rate and Rhythm: Normal rate and regular rhythm.     Heart sounds: Normal heart sounds. No murmur heard.    No gallop.  Pulmonary:     Effort: Pulmonary effort is normal.     Breath sounds: Normal breath sounds.  Abdominal:     General: Bowel sounds are normal.     Palpations: Abdomen is soft. There is no hepatomegaly or splenomegaly.  Musculoskeletal:     Cervical back: Normal range of motion and neck supple.     Right lower leg: No edema.     Left lower leg: No edema.  Lymphadenopathy:     Cervical: No cervical adenopathy.  Skin:    General: Skin is warm and dry.  Neurological:     Mental Status: She is alert and oriented to person, place, and time.  Psychiatric:        Attention and Perception: Attention normal.        Mood and Affect: Mood normal.        Behavior: Behavior normal. Behavior is cooperative.        Thought Content: Thought content normal.        Judgment: Judgment normal.    Results for orders placed or performed in visit on 09/14/21  Pregnancy, urine  Result Value Ref Range    Preg Test, Ur Negative Negative      Assessment & Plan:   Problem List Items  Addressed This Visit       Cardiovascular and Mediastinum   Benign hypertension - Primary    Chronic, stable with BP at goal.  Recommend she monitor BP at least a few mornings a week at home and document.  DASH diet at home.  Continue current medication regimen and adjust as needed.  Labs: CMP, TSH.  Return in 6 months.       Relevant Medications   amLODipine (NORVASC) 5 MG tablet   Other Relevant Orders   Comprehensive metabolic panel   TSH     Musculoskeletal and Integument   Osteopenia    History of reported on chart, no DEXA noted.  Check Vit D level today.  Dexa due at 38.      Relevant Orders   VITAMIN D 25 Hydroxy (Vit-D Deficiency, Fractures)     Other   B12 deficiency    Ongoing.  History of level <300 and reports neuropathy discomfort at times.  Recheck labs today.  Recommend continue Vitamin B12 orally 1000 MCG daily.      Relevant Orders   Vitamin B12   Elevated low density lipoprotein (LDL) cholesterol level    Noted on labs, recheck levels today.  Start medication as needed.      Relevant Orders   Comprehensive metabolic panel   Lipid Panel w/o Chol/HDL Ratio   Generalized anxiety disorder    Chronic, stable without medication.  Denies SI/HI.  At this time continue without medication and initiate in future as needed.      Nicotine dependence, cigarettes, uncomplicated    Ongoing for years since age 73.  She wishes to try Chantix again, will send this in.  Referral to lung screening placed after discussion with patient, she wishes to pursue.      Relevant Medications   varenicline (CHANTIX PAK) 0.5 MG X 11 & 1 MG X 42 tablet   Other Relevant Orders   Ambulatory Referral for Lung Cancer Scre   Vitamin D deficiency    Ongoing, continue supplement and adjust as needed. Vit D check today.      Relevant Orders   VITAMIN D 25 Hydroxy (Vit-D Deficiency, Fractures)   Other  Visit Diagnoses     Elevated alkaline phosphatase level       Check CMP and GGT today.   Relevant Orders   Gamma GT   Encounter for screening mammogram for malignant neoplasm of breast       Relevant Orders   MM 3D SCREEN BREAST BILATERAL        Follow up plan: Return in about 4 months (around 07/08/2022) for Annual physical after 07/07/22.

## 2022-03-01 NOTE — Assessment & Plan Note (Signed)
Chronic, stable with BP at goal.  Recommend she monitor BP at least a few mornings a week at home and document.  DASH diet at home.  Continue current medication regimen and adjust as needed.  Labs: CMP, TSH.  Return in 6 months.

## 2022-03-01 NOTE — Assessment & Plan Note (Signed)
Ongoing.  History of level <300 and reports neuropathy discomfort at times.  Recheck labs today.  Recommend continue Vitamin B12 orally 1000 MCG daily.

## 2022-03-01 NOTE — Assessment & Plan Note (Signed)
History of reported on chart, no DEXA noted.  Check Vit D level today.  Dexa due at 63. 

## 2022-03-01 NOTE — Assessment & Plan Note (Signed)
Noted on labs, recheck levels today.  Start medication as needed.

## 2022-03-01 NOTE — Assessment & Plan Note (Signed)
Chronic, stable without medication.  Denies SI/HI.  At this time continue without medication and initiate in future as needed.

## 2022-03-01 NOTE — Assessment & Plan Note (Signed)
Ongoing, continue supplement and adjust as needed. Vit D check today.

## 2022-03-02 ENCOUNTER — Other Ambulatory Visit: Payer: Self-pay | Admitting: Nurse Practitioner

## 2022-03-02 DIAGNOSIS — E876 Hypokalemia: Secondary | ICD-10-CM

## 2022-03-02 LAB — COMPREHENSIVE METABOLIC PANEL
ALT: 11 IU/L (ref 0–32)
AST: 14 IU/L (ref 0–40)
Albumin/Globulin Ratio: 1.5 (ref 1.2–2.2)
Albumin: 4.5 g/dL (ref 3.8–4.9)
Alkaline Phosphatase: 110 IU/L (ref 44–121)
BUN/Creatinine Ratio: 14 (ref 9–23)
BUN: 9 mg/dL (ref 6–24)
Bilirubin Total: 0.3 mg/dL (ref 0.0–1.2)
CO2: 19 mmol/L — ABNORMAL LOW (ref 20–29)
Calcium: 9.9 mg/dL (ref 8.7–10.2)
Chloride: 107 mmol/L — ABNORMAL HIGH (ref 96–106)
Creatinine, Ser: 0.63 mg/dL (ref 0.57–1.00)
Globulin, Total: 3.1 g/dL (ref 1.5–4.5)
Glucose: 75 mg/dL (ref 70–99)
Potassium: 3.3 mmol/L — ABNORMAL LOW (ref 3.5–5.2)
Sodium: 143 mmol/L (ref 134–144)
Total Protein: 7.6 g/dL (ref 6.0–8.5)
eGFR: 106 mL/min/{1.73_m2} (ref >59)

## 2022-03-02 LAB — TSH: TSH: 0.847 u[IU]/mL (ref 0.450–4.500)

## 2022-03-02 LAB — LIPID PANEL W/O CHOL/HDL RATIO
Cholesterol, Total: 161 mg/dL (ref 100–199)
HDL: 40 mg/dL (ref >39)
LDL Chol Calc (NIH): 98 mg/dL (ref 0–99)
Triglycerides: 131 mg/dL (ref 0–149)
VLDL Cholesterol Cal: 23 mg/dL (ref 5–40)

## 2022-03-02 LAB — VITAMIN B12: Vitamin B-12: 771 pg/mL (ref 232–1245)

## 2022-03-02 LAB — GAMMA GT: GGT: 241 IU/L — ABNORMAL HIGH (ref 0–60)

## 2022-03-02 LAB — VITAMIN D 25 HYDROXY (VIT D DEFICIENCY, FRACTURES): Vit D, 25-Hydroxy: 36.7 ng/mL (ref 30.0–100.0)

## 2022-03-02 NOTE — Progress Notes (Signed)
Contacted via [place] -- need lab only visit in 2 weeks please  The 10-year ASCVD risk score (Arnett DK, et al., 2019) is: 7.1%   Values used to calculate the score:     Age: 51 years     Sex: Female     Is Non-Hispanic African American: Yes     Diabetic: No     Tobacco smoker: Yes     Systolic Blood Pressure: 403 mmHg     Is BP treated: Yes     HDL Cholesterol: 40 mg/dL     Total Cholesterol: 161 mg/dL   Good afternoon Wanda Hudson, your labs have returned: - Kidney function, creatinine and eGFR, remains normal, as is liver function, AST and ALT.  - Potassium level is a little on low side, I recommend you increase your potassium rich food intake at home -- bananas, mangoes, dried fruit, raisin, spinach, nuts.  We should recheck this via outpatient labs in a couple weeks to ensure improvement.  I will have staff call. - Cholesterol labs stable,we will continue to monitor. - Remainder of labs stable -- continue Vitamin D and B12 daily.  Any questions? Keep being amazing!!  Thank you for allowing me to participate in your care.  I appreciate you. Kindest regards, Shantil Vallejo

## 2022-03-03 NOTE — Progress Notes (Signed)
Appt scheduled for 03/17/22 @ 3pm

## 2022-03-17 ENCOUNTER — Other Ambulatory Visit: Payer: BC Managed Care – PPO

## 2022-03-17 DIAGNOSIS — E876 Hypokalemia: Secondary | ICD-10-CM | POA: Diagnosis not present

## 2022-03-18 ENCOUNTER — Other Ambulatory Visit: Payer: Self-pay | Admitting: Nurse Practitioner

## 2022-03-18 DIAGNOSIS — E876 Hypokalemia: Secondary | ICD-10-CM

## 2022-03-18 LAB — BASIC METABOLIC PANEL
BUN/Creatinine Ratio: 30 — ABNORMAL HIGH (ref 9–23)
BUN: 16 mg/dL (ref 6–24)
CO2: 20 mmol/L (ref 20–29)
Calcium: 10 mg/dL (ref 8.7–10.2)
Chloride: 102 mmol/L (ref 96–106)
Creatinine, Ser: 0.54 mg/dL — ABNORMAL LOW (ref 0.57–1.00)
Glucose: 82 mg/dL (ref 70–99)
Potassium: 3.3 mmol/L — ABNORMAL LOW (ref 3.5–5.2)
Sodium: 139 mmol/L (ref 134–144)
eGFR: 110 mL/min/{1.73_m2} (ref >59)

## 2022-03-18 MED ORDER — POTASSIUM CHLORIDE CRYS ER 10 MEQ PO TBCR: 10.0000 meq | EXTENDED_RELEASE_TABLET | Freq: Every day | ORAL | 0 refills | Status: DC

## 2022-03-18 NOTE — Progress Notes (Signed)
Contacted via [place] -- lab recheck outpatient in one week please   Good evening Conchetta, your labs have returned.  Kidney function, eGFR and creatinine, remain stable.  However potassium remains on lower side.  I am going to send in a little bit of potassium for you to take over the next week and would like to recheck this in one week on outpatient labs.  Try increasing potassium rich foods too like bananas, mangoes, potatoes, dried fruit, orange juice.  Any questions? Keep being amazing!!  Thank you for allowing me to participate in your care.  I appreciate you. Kindest regards, Virlan Kempker

## 2022-04-28 ENCOUNTER — Encounter: Payer: Self-pay | Admitting: Gastroenterology

## 2022-04-28 ENCOUNTER — Ambulatory Visit: Payer: BC Managed Care – PPO | Admitting: Gastroenterology

## 2022-04-28 VITALS — BP 149/72 | HR 101 | Temp 98.5°F | Ht 63.0 in | Wt 124.2 lb

## 2022-04-28 DIAGNOSIS — K5909 Other constipation: Secondary | ICD-10-CM

## 2022-04-28 DIAGNOSIS — K640 First degree hemorrhoids: Secondary | ICD-10-CM | POA: Diagnosis not present

## 2022-04-28 NOTE — Progress Notes (Signed)

## 2022-04-28 NOTE — Progress Notes (Signed)
Cephas Darby, MD [address]  [place]  [place], [place] [address]  Main: [phone]  Fax: [phone]    Gastroenterology Consultation  Referring Provider:     Venita Lick, NP Primary Care Physician:  Venita Lick, NP Primary Gastroenterologist:  Dr. Cephas Darby Reason for Consultation:     Symptomatic hemorrhoids        HPI:   Wanda Hudson is a 51 y.o. female referred by Dr. Venita Lick, NP  for consultation & management of symptomatic hemorrhoids.  Patient reports that for last 4 months she has been experiencing rectal bleeding, bright red blood per rectum dripping into the toilet and on wiping associated with rectal pressure, swelling as well as mucousy discharge.  Patient denies constipation, diarrhea.  Sometimes she has sensation of incomplete emptying.  She denies any significant straining.  She does have prolapse of the hemorrhoid that spontaneously reduce after bowel movement.  Follow-up visit 04/28/2022 Patient is here for to discuss about hemorrhoidal symptoms and hemorrhoid banding.  She underwent ligation of right posterior hemorrhoid in 05/2021.  She reported that her symptoms resolved did not have follow-up banding sessions.  She now has recurrence of hemorrhoidal symptoms, predominantly swelling, discomfort and scant blood on wiping.  She does have constipation, sensation of incomplete emptying, spends about 10 minutes on the toilet.   NSAIDs: None  Antiplts/Anticoagulants/Anti thrombotics: None  GI Procedures:  Colonoscopy in 2020 - One 4 mm polyp in the ascending colon, removed with a cold snare. Resected and retrieved. - Two 3 to 4 mm polyps in the descending colon, removed with a cold snare. Resected and retrieved. - Non-bleeding internal hemorrhoids.  DIAGNOSIS:  A.  COLON POLYP, ASCENDING; COLD SNARE:  - HYPERPLASTIC POLYP.  - NEGATIVE FOR DYSPLASIA AND MALIGNANCY.   B.  COLON POLYPS X 2, DESCENDING; COLD SNARE:  -  HYPERPLASTIC POLYPS, 2 FRAGMENTS.  - NEGATIVE FOR DYSPLASIA AND MALIGNANCY.   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 Need office visit for further refills, Disp: 90 tablet, Rfl: 4   Aspirin-Salicylamide-Caffeine 557-322-02.5 MG PACK, Take by mouth as needed., Disp: , Rfl:    Cyanocobalamin (B-12) 1000 MCG CAPS, B12, Disp: , Rfl:    Evening Primrose Oil 500 MG CAPS, Take 500 mg by mouth daily., Disp: , Rfl:    fluticasone (FLONASE) 50 MCG/ACT nasal spray, Place 2 sprays into both nostrils daily., Disp: 16 g, Rfl: 6   potassium chloride (KLOR-CON M) 10 MEQ tablet, Take 1 tablet (10 mEq total) by mouth daily for 7 doses., Disp: 7 tablet, Rfl: 0   varenicline (CHANTIX PAK) 0.5  MG X 11 & 1 MG X 42 tablet, Take one 0.5 mg tablet by mouth once daily for 3 days, then increase to one 0.5 mg tablet twice daily for 4 days, then increase to one 1 mg tablet twice daily., Disp: 53 tablet, Rfl: 0   Vitamin D, Cholecalciferol, 1000 UNITS TABS, Take 1,000 Units by mouth daily., Disp: , Rfl:     Family History  Problem Relation Age of Onset   Diabetes Mother    Hypertension Mother    Stroke Mother    Diabetes Father    Hypertension Father    Lung cancer Father    Heart disease Maternal Grandfather        MI    Heart disease Paternal Grandfather        MI   Hypertension Brother    Aneurysm Maternal Grandmother    Breast cancer Neg Hx      Social History   Tobacco Use   Smoking status: Some Days    Packs/day: 0.50    Years: 21.00    Total pack years: 10.50    Types: Cigarettes   Smokeless tobacco: Never  Vaping Use   Vaping Use: Never used  Substance Use Topics   Alcohol use: No    Comment: occasionally   Drug use: No    Allergies as of 04/28/2022 - Review Complete 04/28/2022  Allergen Reaction Noted   Codeine Rash 06/12/2013    Review of Systems:    All systems reviewed and negative except where noted in HPI.   Physical Exam:  BP (!) 149/72 (BP Location: Left Arm, Patient Position: Sitting, Cuff Size: Normal)   Pulse (!) 101   Temp 98.5 F (36.9 C) (Oral)   Ht '5\' 3"'$  (1.6 m)   Wt 124 lb 4 oz (56.4 kg)   BMI 22.01 kg/m  No LMP recorded. Patient has had an injection.  General:   Alert,  Well-developed, well-nourished, pleasant and cooperative in NAD Head:  Normocephalic and atraumatic. Eyes:  Sclera clear, no icterus.   Conjunctiva pink. Ears:  Normal auditory acuity. Nose:  No deformity, discharge, or lesions. Mouth:  No deformity or lesions,oropharynx pink & moist. Neck:  Supple; no masses or thyromegaly. Lungs:  Respirations even and unlabored.  Clear throughout to auscultation.   No wheezes, crackles, or rhonchi. No acute distress. Heart:  Regular rate and rhythm; no murmurs, clicks, rubs, or gallops. Abdomen:  Normal bowel sounds. Soft, non-tender and non-distended without masses, hepatosplenomegaly or hernias noted.  No guarding or rebound tenderness.   Rectal: Medium size perianal skin tag, nontender digital rectal exam, palpable hemorrhoids Msk:  Symmetrical without gross deformities. Good, equal movement & strength bilaterally. Pulses:  Normal pulses noted. Extremities:  No clubbing or edema.  No cyanosis. Neurologic:  Alert and oriented x3;  grossly normal  neurologically. Skin:  Intact without significant lesions or rashes. No jaundice. Psych:  Alert and cooperative. Normal mood and affect.  Imaging Studies: No abdominal imaging  Assessment and Plan:   Wanda Hudson is a 51 y.o. pleasant African-American female with history of symptomatic grade 1 hemorrhoids and hard bowel movements  I discussed regarding outpatient hemorrhoid ligation including risks and benefits and patient is willing to undergo hemorrhoid ligation today Consent obtained  Start Metamucil with large glass of water daily and MiraLAX if needed   Follow up in 2 weeks   Cephas Darby, MD

## 2022-04-28 NOTE — Patient Instructions (Signed)

## 2022-05-05 ENCOUNTER — Other Ambulatory Visit: Payer: Self-pay | Admitting: Nurse Practitioner

## 2022-05-05 ENCOUNTER — Ambulatory Visit
Admission: RE | Admit: 2022-05-05 | Discharge: 2022-05-05 | Disposition: A | Discharge status: Home / Self Care | Payer: BC Managed Care – PPO | Source: Ambulatory Visit | Attending: Nurse Practitioner | Admitting: Nurse Practitioner

## 2022-05-05 DIAGNOSIS — Z1231 Encounter for screening mammogram for malignant neoplasm of breast: Secondary | ICD-10-CM | POA: Insufficient documentation

## 2022-05-05 DIAGNOSIS — Z789 Other specified health status: Secondary | ICD-10-CM

## 2022-05-05 MED ORDER — MEDROXYPROGESTERONE ACETATE 150 MG/ML IM SUSP
150.0000 mg | INTRAMUSCULAR | Status: DC
  Administered 2022-05-10 – 2022-10-11 (×3): 150 mg via INTRAMUSCULAR

## 2022-05-06 ENCOUNTER — Inpatient Hospital Stay
Admission: RE | Admit: 2022-05-06 | Discharge: 2022-05-06 | Disposition: A | Discharge status: Home / Self Care | Payer: Self-pay | Source: Ambulatory Visit | Attending: *Deleted | Admitting: *Deleted

## 2022-05-06 ENCOUNTER — Other Ambulatory Visit: Payer: Self-pay | Admitting: *Deleted

## 2022-05-06 DIAGNOSIS — Z1231 Encounter for screening mammogram for malignant neoplasm of breast: Secondary | ICD-10-CM

## 2022-05-06 NOTE — Progress Notes (Signed)
Contacted via MyChart   Normal mammogram, may repeat in one year:)

## 2022-05-10 ENCOUNTER — Ambulatory Visit (INDEPENDENT_AMBULATORY_CARE_PROVIDER_SITE_OTHER): Payer: BC Managed Care – PPO

## 2022-05-10 DIAGNOSIS — Z789 Other specified health status: Secondary | ICD-10-CM

## 2022-05-19 ENCOUNTER — Ambulatory Visit: Payer: BC Managed Care – PPO | Admitting: Gastroenterology

## 2022-05-19 ENCOUNTER — Encounter: Payer: Self-pay | Admitting: Gastroenterology

## 2022-05-19 VITALS — BP 135/78 | HR 76 | Temp 98.8°F | Ht 63.0 in | Wt 125.8 lb

## 2022-05-19 DIAGNOSIS — K640 First degree hemorrhoids: Secondary | ICD-10-CM | POA: Diagnosis not present

## 2022-05-19 NOTE — Progress Notes (Signed)

## 2022-06-01 ENCOUNTER — Ambulatory Visit: Payer: BC Managed Care – PPO | Admitting: Gastroenterology

## 2022-06-01 ENCOUNTER — Encounter: Payer: Self-pay | Admitting: Gastroenterology

## 2022-06-01 VITALS — BP 156/88 | HR 94 | Temp 98.3°F | Ht 63.0 in | Wt 127.4 lb

## 2022-06-01 DIAGNOSIS — K640 First degree hemorrhoids: Secondary | ICD-10-CM | POA: Diagnosis not present

## 2022-06-01 NOTE — Progress Notes (Signed)

## 2022-06-15 NOTE — Addendum Note (Signed)
Encounter addended by: Temple Pacini on: 06/15/2022 4:30 PM  Actions taken: Imaging Exam ended

## 2022-06-15 NOTE — Addendum Note (Signed)
Encounter addended by: Temple Pacini on: 06/15/2022 4:34 PM  Actions taken: Imaging Exam ended

## 2022-07-18 DIAGNOSIS — E876 Hypokalemia: Secondary | ICD-10-CM | POA: Insufficient documentation

## 2022-07-18 DIAGNOSIS — Z3042 Encounter for surveillance of injectable contraceptive: Secondary | ICD-10-CM | POA: Insufficient documentation

## 2022-07-18 NOTE — Patient Instructions (Signed)

## 2022-07-20 ENCOUNTER — Ambulatory Visit (INDEPENDENT_AMBULATORY_CARE_PROVIDER_SITE_OTHER): Payer: BC Managed Care – PPO | Admitting: Nurse Practitioner

## 2022-07-20 ENCOUNTER — Encounter: Payer: Self-pay | Admitting: Nurse Practitioner

## 2022-07-20 VITALS — BP 118/72 | HR 98 | Temp 98.0°F | Resp 20 | Ht 62.99 in | Wt 127.0 lb

## 2022-07-20 DIAGNOSIS — E876 Hypokalemia: Secondary | ICD-10-CM | POA: Diagnosis not present

## 2022-07-20 DIAGNOSIS — Z0000 Encounter for general adult medical examination without abnormal findings: Secondary | ICD-10-CM

## 2022-07-20 DIAGNOSIS — Z23 Encounter for immunization: Secondary | ICD-10-CM

## 2022-07-20 DIAGNOSIS — E538 Deficiency of other specified B group vitamins: Secondary | ICD-10-CM | POA: Diagnosis not present

## 2022-07-20 DIAGNOSIS — I10 Essential (primary) hypertension: Secondary | ICD-10-CM | POA: Diagnosis not present

## 2022-07-20 DIAGNOSIS — Z3042 Encounter for surveillance of injectable contraceptive: Secondary | ICD-10-CM | POA: Diagnosis not present

## 2022-07-20 DIAGNOSIS — F411 Generalized anxiety disorder: Secondary | ICD-10-CM

## 2022-07-20 DIAGNOSIS — F17210 Nicotine dependence, cigarettes, uncomplicated: Secondary | ICD-10-CM

## 2022-07-20 DIAGNOSIS — E7800 Pure hypercholesterolemia, unspecified: Secondary | ICD-10-CM | POA: Diagnosis not present

## 2022-07-20 DIAGNOSIS — M8580 Other specified disorders of bone density and structure, unspecified site: Secondary | ICD-10-CM | POA: Diagnosis not present

## 2022-07-20 LAB — PREGNANCY, URINE: Preg Test, Ur: NEGATIVE

## 2022-07-20 NOTE — Assessment & Plan Note (Signed)
History of reported on chart, no DEXA noted.  Check Vit D level today.  Dexa due at 69.

## 2022-07-20 NOTE — Assessment & Plan Note (Signed)
Noted past labs, recent 3.3 -- recheck today.

## 2022-07-20 NOTE — Assessment & Plan Note (Signed)
Obtains on scheduled -- urine pregnancy testing annually.

## 2022-07-20 NOTE — Progress Notes (Signed)
BP 118/72 (BP Location: Left Arm, Patient Position: Sitting, Cuff Size: Normal)   Pulse 98   Temp 98 F (36.7 C) (Oral)   Resp 20   Ht 5' 2.99" (1.6 m)   Wt 127 lb (57.6 kg)   SpO2 98%   BMI 22.50 kg/m    Subjective:    Patient ID: Wanda Hudson, female    DOB: Dec 06, 1970, 51 y.o.   MRN: [id number]  HPI: Wanda Hudson is a 51 y.o. female presenting on 07/20/2022 for comprehensive medical examination. Current medical complaints include:none  She currently lives with: self Menopausal Symptoms: no  HYPERTENSION Continues on Amlodipine.  Continues to smoke 1/2 PPD -- has smoked since age 78.  Osteopenia noted on past imaging, taking supplement Vit D. Hypertension status: stable  Satisfied with current treatment? yes Duration of hypertension: chronic BP monitoring frequency:  a few times a week BP range: 120-130/70-80 range at work BP medication side effects:  no Medication compliance: good compliance Aspirin: no Recurrent headaches: no Visual changes: no Palpitations: no Dyspnea: no Chest pain: no Lower extremity edema: no Dizzy/lightheaded: no  The 10-year ASCVD risk score (Arnett DK, et al., 2019) is: 7.3%   Values used to calculate the score:     Age: 53 years     Sex: Female     Is Non-Hispanic African American: Yes     Diabetic: No     Tobacco smoker: Yes     Systolic Blood Pressure: 024 mmHg     Is BP treated: Yes     HDL Cholesterol: 40 mg/dL     Total Cholesterol: 161 mg/dL   Depression Screen done today and results listed below:     07/20/2022    8:25 AM 03/01/2022    1:30 PM 01/30/2021    3:02 PM 09/04/2020    8:02 AM 06/23/2020    8:20 AM  Depression screen PHQ 2/9  Decreased Interest 0 0 0 0 1  Down, Depressed, Hopeless 0 0 0 0 2  PHQ - 2 Score 0 0 0 0 3  Altered sleeping 0 0  0 1  Tired, decreased energy 0 0  0 1  Change in appetite 0 0  0 0  Feeling bad or failure about yourself  0 0  0 0  Trouble concentrating 0 0  0 0  Moving slowly or  fidgety/restless 0 0  0 0  Suicidal thoughts 0 0  0 0  PHQ-9 Score 0 0  0 5  Difficult doing work/chores Not difficult at all Not difficult at all   Somewhat difficult    Functional Status Survey: Is the patient deaf or have difficulty hearing?: No Does the patient have difficulty seeing, even when wearing glasses/contacts?: No Does the patient have difficulty concentrating, remembering, or making decisions?: No Does the patient have difficulty walking or climbing stairs?: No Does the patient have difficulty dressing or bathing?: No Does the patient have difficulty doing errands alone such as visiting a doctor's office or shopping?: No      06/29/2019    9:15 AM 09/04/2020    8:02 AM 03/01/2022    1:30 PM 07/20/2022    8:25 AM 07/20/2022    8:46 AM  Fall Risk  Falls in the past year?  0 0 0 0  Was there an injury with Fall?   0 0 0  Fall Risk Category Calculator   0 0 0  Fall Risk Category   Low Low Low  Patient Fall Risk Level Low fall risk  Low fall risk  Low fall risk  Patient at Risk for Falls Due to   No Fall Risks No Fall Risks No Fall Risks  Fall risk Follow up   Falls evaluation completed Falls evaluation completed Falls prevention discussed     Past Medical History:  Past Medical History:  Diagnosis Date   Anxiety    Breast cyst, left 2017   Depression    Hypertension    Insomnia    Osteopenia    Peripheral neuropathy     Surgical History:  Past Surgical History:  Procedure Laterality Date   BREAST CYST ASPIRATION Left 2017   [facility]   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   amLODipine (NORVASC) 5 MG tablet Take 1 tablet (5 mg total) by mouth daily. Need office visit for further refills   Aspirin-Salicylamide-Caffeine 697-948-01.6 MG PACK Take by mouth as needed.   Cyanocobalamin (B-12) 1000 MCG CAPS B12   Evening Primrose Oil 500 MG CAPS Take 500 mg by mouth daily.   fluticasone (FLONASE) 50 MCG/ACT nasal spray Place 2 sprays into both nostrils daily.   potassium chloride (KLOR-CON M) 10 MEQ tablet Take 1 tablet (10 mEq total) by mouth daily for 7 doses.   Vitamin D, Cholecalciferol, 1000 UNITS TABS Take 1,000 Units by mouth daily.   Current Facility-Administered Medications on File Prior to Visit  Medication   medroxyPROGESTERone (DEPO-PROVERA) injection 150 mg    Allergies:  Allergies  Allergen Reactions   Codeine Rash    Social History:  Social History   Socioeconomic History   Marital status: Single    Spouse name: Not on file   Number of children: Not on file   Years of education: Not on file   Highest education level: Not on file  Occupational History   Not on file  Tobacco Use   Smoking status: Some Days    Packs/day: 0.50    Years: 21.00    Total pack years: 10.50    Types: Cigarettes   Smokeless tobacco: Never  Vaping Use   Vaping Use: Never used  Substance and Sexual Activity   Alcohol use: No    Comment: occasionally   Drug use: No   Sexual activity: Yes    Birth control/protection: Injection  Other Topics Concern   Not on file  Social History Narrative   Not on file   Social Determinants of Health   Financial Resource Strain: Not on file  Food Insecurity: Not on file  Transportation Needs: Not on file  Physical Activity: Not on file  Stress: Not on file  Social Connections: Not on file  Intimate Partner Violence: Not on file   Social History   Tobacco Use  Smoking Status Some Days   Packs/day: 0.50   Years: 21.00   Total pack years: 10.50   Types: Cigarettes  Smokeless Tobacco Never   Social  History   Substance and Sexual Activity  Alcohol Use No   Comment: occasionally    Family  History:  Family History  Problem Relation Age of Onset   Diabetes Mother    Hypertension Mother    Stroke Mother    Diabetes Father    Hypertension Father    Lung cancer Father    Heart disease Maternal Grandfather        MI   Heart disease Paternal Grandfather        MI   Hypertension Brother    Aneurysm Maternal Grandmother    Breast cancer Neg Hx    Past medical history, surgical history, medications, allergies, family history and social history reviewed with patient today and changes made to appropriate areas of the chart.   ROS All other ROS negative except what is listed above and in the HPI.      Objective:    BP 118/72 (BP Location: Left Arm, Patient Position: Sitting, Cuff Size: Normal)   Pulse 98   Temp 98 F (36.7 C) (Oral)   Resp 20   Ht 5' 2.99" (1.6 m)   Wt 127 lb (57.6 kg)   SpO2 98%   BMI 22.50 kg/m   Wt Readings from Last 3 Encounters:  07/20/22 127 lb (57.6 kg)  06/01/22 127 lb 6 oz (57.8 kg)  05/19/22 125 lb 12.8 oz (57.1 kg)    Physical Exam Vitals and nursing note reviewed.  Constitutional:      General: She is awake. She is not in acute distress.    Appearance: She is well-developed and well-groomed. She is not ill-appearing or toxic-appearing.  HENT:     Head: Normocephalic and atraumatic.     Right Ear: Hearing, tympanic membrane, ear canal and external ear normal. No drainage.     Left Ear: Hearing, tympanic membrane, ear canal and external ear normal. No drainage.     Nose: Nose normal.     Right Sinus: No maxillary sinus tenderness or frontal sinus tenderness.     Left Sinus: No maxillary sinus tenderness or frontal sinus tenderness.     Mouth/Throat:     Mouth: Mucous membranes are moist.     Pharynx: Oropharynx is clear. Uvula midline. No pharyngeal swelling, oropharyngeal exudate or posterior oropharyngeal erythema.  Eyes:     General:  Lids are normal.        Right eye: No discharge.        Left eye: No discharge.     Extraocular Movements: Extraocular movements intact.     Conjunctiva/sclera: Conjunctivae normal.     Pupils: Pupils are equal, round, and reactive to light.     Visual Fields: Right eye visual fields normal and left eye visual fields normal.  Neck:     Thyroid: No thyromegaly.     Vascular: No carotid bruit.     Trachea: Trachea normal.  Cardiovascular:     Rate and Rhythm: Normal rate and regular rhythm.     Heart sounds: Normal heart sounds. No murmur heard.    No gallop.  Pulmonary:     Effort: Pulmonary effort is normal. No accessory muscle usage or respiratory distress.     Breath sounds: Normal breath sounds.  Abdominal:     General: Bowel sounds are normal.     Palpations: Abdomen is soft. There is no hepatomegaly or splenomegaly.     Tenderness: There is no abdominal tenderness.  Musculoskeletal:        General: Normal range of motion.     Cervical back: Normal range of motion and neck supple.  Right lower leg: No edema.     Left lower leg: No edema.  Lymphadenopathy:     Head:     Right side of head: No submental, submandibular, tonsillar, preauricular or posterior auricular adenopathy.     Left side of head: No submental, submandibular, tonsillar, preauricular or posterior auricular adenopathy.     Cervical: No cervical adenopathy.  Skin:    General: Skin is warm and dry.     Capillary Refill: Capillary refill takes less than 2 seconds.     Findings: No rash.  Neurological:     Mental Status: She is alert and oriented to person, place, and time.     Gait: Gait is intact.     Deep Tendon Reflexes: Reflexes are normal and symmetric.     Reflex Scores:      Brachioradialis reflexes are 2+ on the right side and 2+ on the left side.      Patellar reflexes are 2+ on the right side and 2+ on the left side. Psychiatric:        Attention and Perception: Attention normal.        Mood  and Affect: Mood normal.        Speech: Speech normal.        Behavior: Behavior normal. Behavior is cooperative.        Thought Content: Thought content normal.        Judgment: Judgment normal.    Results for orders placed or performed in visit on 89/38/10  Basic Metabolic Panel (BMET)  Result Value Ref Range   Glucose 82 70 - 99 mg/dL   BUN 16 6 - 24 mg/dL   Creatinine, Ser 0.54 (L) 0.57 - 1.00 mg/dL   eGFR 110 >59 mL/min/1.73   BUN/Creatinine Ratio 30 (H) 9 - 23   Sodium 139 134 - 144 mmol/L   Potassium 3.3 (L) 3.5 - 5.2 mmol/L   Chloride 102 96 - 106 mmol/L   CO2 20 20 - 29 mmol/L   Calcium 10.0 8.7 - 10.2 mg/dL      Assessment & Plan:   Problem List Items Addressed This Visit       Cardiovascular and Mediastinum   Benign hypertension - Primary    Chronic, stable.  BP at goal on recheck today and at work.  Recommend she monitor BP at least a few mornings a week at home and document.  DASH diet at home.  Continue current medication regimen and adjust as needed.  Labs: CMP, TSH, CBC.  Return in 1 year for physical.       Relevant Orders   CBC with Differential/Platelet   TSH     Musculoskeletal and Integument   Osteopenia    History of reported on chart, no DEXA noted.  Check Vit D level today.  Dexa due at 60.      Relevant Orders   VITAMIN D 25 Hydroxy (Vit-D Deficiency, Fractures)     Other   Depo-Provera contraceptive status    Obtains on scheduled -- urine pregnancy testing annually.      Relevant Orders   Pregnancy, urine   Elevated low density lipoprotein (LDL) cholesterol level    Noted on labs, recheck levels today.  Start medication as needed.  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      Relevant Orders   Comprehensive metabolic panel   Lipid Panel w/o Chol/HDL Ratio   Hypokalemia    Noted past labs, recent 3.3 --  recheck today.       Relevant Orders   Comprehensive metabolic panel   Nicotine dependence, cigarettes,  uncomplicated    I have recommended complete cessation of tobacco use. I have discussed various options available for assistance with tobacco cessation including over the counter methods (Nicotine gum, patch and lozenges). We also discussed prescription options (Chantix, Nicotine Inhaler / Nasal Spray). The patient is not interested in pursuing any prescription tobacco cessation options at this time.  Recommend annual lung CT -- she will think about this.       Other Visit Diagnoses     B12 deficiency       History of lows reported, check today and intiate supplement as needed.   Relevant Orders   CBC with Differential/Platelet   Vitamin B12   Encounter for annual physical exam       Annual physical today with labs and health maintenance reviewed, discussed with patient.        Follow up plan: Return in about 1 year (around 07/21/2023) for Annual Exam.   LABORATORY TESTING:  - Pap smear: up to date  IMMUNIZATIONS:   - Tdap: Tetanus vaccination status reviewed: last tetanus booster within 10 years. - Influenza: Up to date - Pneumovax: Not applicable - Prevnar: Not applicable - COVID: Up to date - HPV: Not applicable - Shingrix vaccine: Refused - CT LUNG -- wishes to think about this  SCREENING: -Mammogram: Up to date  - Colonoscopy: Up to date  - Bone Density: Not applicable  -Hearing Test: Not applicable  -Spirometry: Not applicable   PATIENT COUNSELING:   Advised to take 1 mg of folate supplement per day if capable of pregnancy.   Sexuality: Discussed sexually transmitted diseases, partner selection, use of condoms, avoidance of unintended pregnancy  and contraceptive alternatives.   Advised to avoid cigarette smoking.  I discussed with the patient that most people either abstain from alcohol or drink within safe limits (<=14/week and <=4 drinks/occasion for males, <=7/weeks and <= 3 drinks/occasion for females) and that the risk for alcohol disorders and other  health effects rises proportionally with the number of drinks per week and how often a drinker exceeds daily limits.  Discussed cessation/primary prevention of drug use and availability of treatment for abuse.   Diet: Encouraged to adjust caloric intake to maintain  or achieve ideal body weight, to reduce intake of dietary saturated fat and total fat, to limit sodium intake by avoiding high sodium foods and not adding table salt, and to maintain adequate dietary potassium and calcium preferably from fresh fruits, vegetables, and low-fat dairy products.    Stressed the importance of regular exercise  Injury prevention: Discussed safety belts, safety helmets, smoke detector, smoking near bedding or upholstery.   Dental health: Discussed importance of regular tooth brushing, flossing, and dental visits.    NEXT PREVENTATIVE PHYSICAL DUE IN 1 YEAR. Return in about 1 year (around 07/21/2023) for Annual Exam.

## 2022-07-20 NOTE — Assessment & Plan Note (Signed)
I have recommended complete cessation of tobacco use. I have discussed various options available for assistance with tobacco cessation including over the counter methods (Nicotine gum, patch and lozenges). We also discussed prescription options (Chantix, Nicotine Inhaler / Nasal Spray). The patient is not interested in pursuing any prescription tobacco cessation options at this time.  Recommend annual lung CT -- she will think about this.

## 2022-07-20 NOTE — Assessment & Plan Note (Signed)
Chronic, stable.  BP at goal on recheck today and at work.  Recommend she monitor BP at least a few mornings a week at home and document.  DASH diet at home.  Continue current medication regimen and adjust as needed.  Labs: CMP, TSH, CBC.  Return in 1 year for physical.

## 2022-07-20 NOTE — Assessment & Plan Note (Signed)
Noted on labs, recheck levels today.  Start medication as needed.  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

## 2022-07-21 LAB — VITAMIN D 25 HYDROXY (VIT D DEFICIENCY, FRACTURES): Vit D, 25-Hydroxy: 17.3 ng/mL — ABNORMAL LOW (ref 30.0–100.0)

## 2022-07-21 LAB — COMPREHENSIVE METABOLIC PANEL
ALT: 9 IU/L (ref 0–32)
AST: 14 IU/L (ref 0–40)
Albumin/Globulin Ratio: 1.9 (ref 1.2–2.2)
Albumin: 5 g/dL — ABNORMAL HIGH (ref 3.8–4.9)
Alkaline Phosphatase: 114 IU/L (ref 44–121)
BUN/Creatinine Ratio: 21 (ref 9–23)
BUN: 11 mg/dL (ref 6–24)
Bilirubin Total: 0.3 mg/dL (ref 0.0–1.2)
CO2: 20 mmol/L (ref 20–29)
Calcium: 9.7 mg/dL (ref 8.7–10.2)
Chloride: 102 mmol/L (ref 96–106)
Creatinine, Ser: 0.52 mg/dL — ABNORMAL LOW (ref 0.57–1.00)
Globulin, Total: 2.7 g/dL (ref 1.5–4.5)
Glucose: 90 mg/dL (ref 70–99)
Potassium: 3.5 mmol/L (ref 3.5–5.2)
Sodium: 139 mmol/L (ref 134–144)
Total Protein: 7.7 g/dL (ref 6.0–8.5)
eGFR: 111 mL/min/{1.73_m2} (ref >59)

## 2022-07-21 LAB — CBC WITH DIFFERENTIAL/PLATELET
Basophils Absolute: 0.1 10*3/uL (ref 0.0–0.2)
Basos: 1 %
EOS (ABSOLUTE): 0.1 10*3/uL (ref 0.0–0.4)
Eos: 1 %
Hematocrit: 40.7 % (ref 34.0–46.6)
Hemoglobin: 13.2 g/dL (ref 11.1–15.9)
Immature Grans (Abs): 0 10*3/uL (ref 0.0–0.1)
Immature Granulocytes: 0 %
Lymphocytes Absolute: 2.7 10*3/uL (ref 0.7–3.1)
Lymphs: 33 %
MCH: 28.8 pg (ref 26.6–33.0)
MCHC: 32.4 g/dL (ref 31.5–35.7)
MCV: 89 fL (ref 79–97)
Monocytes Absolute: 0.7 10*3/uL (ref 0.1–0.9)
Monocytes: 8 %
Neutrophils Absolute: 4.5 10*3/uL (ref 1.4–7.0)
Neutrophils: 57 %
Platelets: 374 10*3/uL (ref 150–450)
RBC: 4.58 x10E6/uL (ref 3.77–5.28)
RDW: 11.8 % (ref 11.7–15.4)
WBC: 8 10*3/uL (ref 3.4–10.8)

## 2022-07-21 LAB — TSH: TSH: 0.85 u[IU]/mL (ref 0.450–4.500)

## 2022-07-21 LAB — LIPID PANEL W/O CHOL/HDL RATIO
Cholesterol, Total: 193 mg/dL (ref 100–199)
HDL: 46 mg/dL (ref >39)
LDL Chol Calc (NIH): 120 mg/dL — ABNORMAL HIGH (ref 0–99)
Triglycerides: 154 mg/dL — ABNORMAL HIGH (ref 0–149)
VLDL Cholesterol Cal: 27 mg/dL (ref 5–40)

## 2022-07-21 LAB — VITAMIN B12: Vitamin B-12: 476 pg/mL (ref 232–1245)

## 2022-07-21 NOTE — Progress Notes (Signed)
Contacted via MyChart The 10-year ASCVD risk score (Arnett DK, et al., 2019) is: 7.4%   Values used to calculate the score:     Age: 51 years     Sex: Female     Is Non-Hispanic African American: Yes     Diabetic: No     Tobacco smoker: Yes     Systolic Blood Pressure: 846 mmHg     Is BP treated: Yes     HDL Cholesterol: 46 mg/dL     Total Cholesterol: 193 mg/dL   Good evening Ivin Booty, your labs have returned: - Kidney function, creatinine and eGFR, remains normal, as is liver function, AST and ALT.  - LDL, bad cholesterol, level a bit elevated -- focus heavily on diet changes and regular activity. - Vitamin D a little low, please ensure you are taking Vitamin D3 2000 units daily for bone health. - Remainder of labs are all stable.  Any questions? Keep being amazing!!  Thank you for allowing me to participate in your care.  I appreciate you. Kindest regards, Brittin Belnap

## 2022-07-26 ENCOUNTER — Ambulatory Visit (INDEPENDENT_AMBULATORY_CARE_PROVIDER_SITE_OTHER): Payer: BC Managed Care – PPO

## 2022-07-26 DIAGNOSIS — Z3042 Encounter for surveillance of injectable contraceptive: Secondary | ICD-10-CM | POA: Diagnosis not present

## 2022-07-26 DIAGNOSIS — Z789 Other specified health status: Secondary | ICD-10-CM | POA: Diagnosis not present

## 2022-10-11 ENCOUNTER — Ambulatory Visit (INDEPENDENT_AMBULATORY_CARE_PROVIDER_SITE_OTHER): Payer: BC Managed Care – PPO

## 2022-10-11 DIAGNOSIS — Z3042 Encounter for surveillance of injectable contraceptive: Secondary | ICD-10-CM

## 2022-10-11 DIAGNOSIS — Z789 Other specified health status: Secondary | ICD-10-CM | POA: Diagnosis not present

## 2022-12-28 ENCOUNTER — Ambulatory Visit (INDEPENDENT_AMBULATORY_CARE_PROVIDER_SITE_OTHER): Payer: BC Managed Care – PPO

## 2022-12-28 DIAGNOSIS — Z3042 Encounter for surveillance of injectable contraceptive: Secondary | ICD-10-CM | POA: Diagnosis not present

## 2022-12-28 MED ORDER — MEDROXYPROGESTERONE ACETATE 150 MG/ML IM SUSY
150.0000 mg | PREFILLED_SYRINGE | Freq: Once | INTRAMUSCULAR | Status: AC
  Administered 2022-12-28: 150 mg via INTRAMUSCULAR

## 2022-12-31 ENCOUNTER — Ambulatory Visit: Payer: BC Managed Care – PPO

## 2023-01-07 ENCOUNTER — Ambulatory Visit (INDEPENDENT_AMBULATORY_CARE_PROVIDER_SITE_OTHER): Payer: BC Managed Care – PPO

## 2023-01-07 DIAGNOSIS — Z23 Encounter for immunization: Secondary | ICD-10-CM

## 2023-02-09 NOTE — Progress Notes (Unsigned)
Wanda Amy, PA-C [address]  [address]  [place], [place] [address]  Main: [phone]  Fax: [phone]   Primary Care Physician: Marjie Skiff, NP  Primary Gastroenterologist:  Dr. Lannette Donath   CC: Bleeding hemorrhoids  HPI: Wanda Hudson is a 52 y.o. female presents for evaluation of rectal bleeding and hemorrhoids.  She underwent internal hemorrhoid banding x 3 by Dr. Allegra Lai 04/2022 - 05/2022.  She had symptomatic grade 1 hemorrhoids, unresponsive to maximal medical therapy.  Last colonoscopy 06/2019 by Dr. Servando Snare showed 3 small (3 mm to 4 mm) hyperplastic polyps removed and internal hemorrhoids.  Repeat screening colonoscopy in 10 years.  Last CBC 07/2022 showed normal hemoglobin 13.2.  Current symptoms: Patient states she has had 2 episodes of bright red rectal bleeding in the past week.  1 episode last week and 1 episode 2 days ago.  She saw a lot of bright red blood in the toilet.  She has not had any constipation, rectal pain, diarrhea, abdominal pain, or other GI symptoms.  She denies hard stools or straining.  She has a normal soft bowel movement daily.  She is worried about recurrent hemorrhoids.  She states prescription cream and suppositories do not help her.   Current Outpatient Medications  Medication Sig Dispense Refill   Alpha-Lipoic Acid 100 MG TABS Take 1 tablet by mouth daily.     amLODipine (NORVASC) 5 MG tablet Take 1 tablet (5 mg total) by mouth daily. Need office visit for further refills 90 tablet 4   Aspirin-Salicylamide-Caffeine 650-195-33.3 MG PACK Take by mouth as needed.     Cyanocobalamin (B-12) 1000 MCG CAPS B12     Evening Primrose Oil 500 MG CAPS Take 500 mg by mouth daily.     fluticasone (FLONASE) 50 MCG/ACT nasal spray Place 2 sprays into both nostrils daily. 16 g 6   Vitamin D, Cholecalciferol, 1000 UNITS TABS Take 1,000 Units by mouth daily.     potassium chloride (KLOR-CON M) 10 MEQ tablet Take 1 tablet (10 mEq total) by  mouth daily for 7 doses. 7 tablet 0   Current Facility-Administered Medications  Medication Dose Route Frequency Provider Last Rate Last Admin   medroxyPROGESTERone (DEPO-PROVERA) injection 150 mg  150 mg Intramuscular Q90 days Cannady, Jolene T, NP   150 mg at 10/11/22 1519    Allergies as of 02/10/2023 - Review Complete 02/10/2023  Allergen Reaction Noted   Codeine Rash 06/12/2013    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   Physical Examination:   BP 128/79   Pulse 87   Temp 98.6 F (37 C)   Ht 5\' 6"  (1.676 m)   Wt 126 lb 9.6 oz (57.4 kg)   BMI 20.43 kg/m   General:  Well-nourished, well-developed in no acute distress.  Eyes: No icterus. Conjunctivae pink. Mouth: Oropharyngeal mucosa moist and pink , no lesions erythema or exudate. Lungs: Clear to auscultation bilaterally. Non-labored. Heart: Regular rate and rhythm, no murmurs rubs or gallops.  Abdomen: Bowel sounds are normal; Abdomen is Soft; No hepatosplenomegaly, masses or hernias;  No Abdominal Tenderness; No guarding or rebound tenderness. Rectal: Anoscope exam was performed.  There were moderate large external hemorrhoids which were not thrombosed or swollen.  Moderate internal hemorrhoids, nonbleeding.  No rectal masses or tenderness.  No evidence of fissure. Extremities: No lower extremity edema. No clubbing or  deformities. Neuro: Alert and oriented x 3.  Grossly intact. Skin: Warm and dry, no jaundice.   Psych: Alert and cooperative, normal mood and affect.   Imaging Studies: No results found.  Assessment and Plan:   Wanda Hudson is a 52 y.o. y/o female presents for rectal bleeding and internal hemorrhoids.  1.  Internal hemorrhoids 2.  Rectal bleeding  Plan: 1.)  Recommended for patient to schedule a repeat colonoscopy and she adamantly refused. 2.)  Recommended CBC labs today to check for anemia, and patient refused. 3.)  I discussed treatment of hemorrhoids at length.  I offered prescriptions for hydrocortisone cream or suppository, and patient adamantly declined.  She states they do not work. 4.)  I will have her come back for follow-up with Dr. Allegra Lai to consider repeat internal hemorrhoid banding.   Wanda Amy, PA-C  Follow up in next available with Dr. Allegra Lai for internal hemorrhoid banding.

## 2023-02-10 ENCOUNTER — Ambulatory Visit: Payer: BC Managed Care – PPO | Admitting: Physician Assistant

## 2023-02-10 ENCOUNTER — Encounter: Payer: Self-pay | Admitting: Physician Assistant

## 2023-02-10 VITALS — BP 128/79 | HR 87 | Temp 98.6°F | Ht 66.0 in | Wt 126.6 lb

## 2023-02-10 DIAGNOSIS — K649 Unspecified hemorrhoids: Secondary | ICD-10-CM | POA: Diagnosis not present

## 2023-02-10 DIAGNOSIS — K625 Hemorrhage of anus and rectum: Secondary | ICD-10-CM

## 2023-03-15 ENCOUNTER — Ambulatory Visit: Payer: BC Managed Care – PPO

## 2023-03-17 ENCOUNTER — Other Ambulatory Visit: Payer: Self-pay | Admitting: Nurse Practitioner

## 2023-03-17 NOTE — Telephone Encounter (Signed)
Requested Prescriptions  Pending Prescriptions Disp Refills   amLODipine (NORVASC) 5 MG tablet [Pharmacy Med Name: AMLODIPINE BESYLATE 5 MG TAB] 90 tablet 0    Sig: TAKE 1 TABLET (5 MG TOTAL) BY MOUTH DAILY. NEED OFFICE VISIT FOR FURTHER REFILLS     Cardiovascular: Calcium Channel Blockers 2 Failed - 03/17/2023  2:44 AM      Failed - Valid encounter within last 6 months    Recent Outpatient Visits           8 months ago Benign hypertension   [place] [facility] [place], Corrie Dandy T, NP   1 year ago Benign hypertension   [facility] [place], Corrie Dandy T, NP   1 year ago Seasonal allergic rhinitis, unspecified trigger   [place] Larae Grooms, NP   2 years ago Benign hypertension   [place] [facility] [place], Corrie Dandy T, NP   2 years ago Rectal bleed   [place] [facility] [place], Dorie Rank, NP       Future Appointments             In 4 months Cannady, Madison T, NP [place] [facility]            Passed - Last BP in normal range    BP Readings from Last 1 Encounters:  02/10/23 128/79         Passed - Last Heart Rate in normal range    Pulse Readings from Last 1 Encounters:  02/10/23 87

## 2023-03-21 ENCOUNTER — Ambulatory Visit: Payer: BC Managed Care – PPO | Admitting: Gastroenterology

## 2023-04-08 ENCOUNTER — Ambulatory Visit (INDEPENDENT_AMBULATORY_CARE_PROVIDER_SITE_OTHER): Payer: BC Managed Care – PPO

## 2023-04-08 DIAGNOSIS — Z23 Encounter for immunization: Secondary | ICD-10-CM

## 2023-05-31 ENCOUNTER — Ambulatory Visit (INDEPENDENT_AMBULATORY_CARE_PROVIDER_SITE_OTHER): Payer: BC Managed Care – PPO

## 2023-05-31 DIAGNOSIS — Z3042 Encounter for surveillance of injectable contraceptive: Secondary | ICD-10-CM

## 2023-05-31 MED ORDER — MEDROXYPROGESTERONE ACETATE 150 MG/ML IM SUSY
PREFILLED_SYRINGE | INTRAMUSCULAR | Status: AC
  Administered 2024-02-06: 150 mg via INTRAMUSCULAR

## 2023-06-17 ENCOUNTER — Other Ambulatory Visit: Payer: Self-pay | Admitting: Nurse Practitioner

## 2023-06-17 MED ORDER — AMLODIPINE BESYLATE 5 MG PO TABS: 5.0000 mg | ORAL_TABLET | Freq: Every day | ORAL | 0 refills | Status: DC

## 2023-06-17 NOTE — Telephone Encounter (Signed)
Requested Prescriptions  Pending Prescriptions Disp Refills   amLODipine (NORVASC) 5 MG tablet 30 tablet 0    Sig: Take 1 tablet (5 mg total) by mouth daily. Need office visit for further refills     Cardiovascular: Calcium Channel Blockers 2 Failed - 06/17/2023 11:08 AM      Failed - Valid encounter within last 6 months    Recent Outpatient Visits           11 months ago Benign hypertension   [place] [facility] [place], Corrie Dandy T, NP   1 year ago Benign hypertension   [place] [facility] [place], Corrie Dandy T, NP   1 year ago Seasonal allergic rhinitis, unspecified trigger   [place] [facility] Larae Grooms, NP   2 years ago Benign hypertension   [place] [facility] [place], Corrie Dandy T, NP   2 years ago Rectal bleed   [place] [facility] [place], Dorie Rank, NP       Future Appointments             In 1 month [place], [place] T, NP [place] [facility]            Passed - Last BP in normal range    BP Readings from Last 1 Encounters:  02/10/23 128/79         Passed - Last Heart Rate in normal range    Pulse Readings from Last 1 Encounters:  02/10/23 87

## 2023-06-17 NOTE — Telephone Encounter (Signed)
Requested medication (s) are due for refill today:   Yes  Requested medication (s) are on the active medication list:   Yes  Future visit scheduled:   Yes 07/22/2023 with Jolene   Last ordered: 03/17/2023 #90, 0 refills   Needs OV for further refills per note  Returned for provider review for refills before OV 12/6.   Requested Prescriptions  Pending Prescriptions Disp Refills   amLODipine (NORVASC) 5 MG tablet [Pharmacy Med Name: AMLODIPINE BESYLATE 5 MG TAB] 90 tablet 0    Sig: TAKE 1 TABLET (5 MG TOTAL) BY MOUTH DAILY. NEED OFFICE VISIT FOR FURTHER REFILLS     Cardiovascular: Calcium Channel Blockers 2 Failed - 06/17/2023  2:45 AM      Failed - Valid encounter within last 6 months    Recent Outpatient Visits           11 months ago Benign hypertension   [place] [facility] [place], Corrie Dandy T, NP   1 year ago Benign hypertension   [place] [facility] [place], Corrie Dandy T, NP   1 year ago Seasonal allergic rhinitis, unspecified trigger   [place] [facility] Larae Grooms, NP   2 years ago Benign hypertension   [place] [facility] [place], Corrie Dandy T, NP   2 years ago Rectal bleed   [place] [facility] [place], Dorie Rank, NP       Future Appointments             In 1 month [place], [place] T, NP [place] [facility]            Passed - Last BP in normal range    BP Readings from Last 1 Encounters:  02/10/23 128/79         Passed - Last Heart Rate in normal range    Pulse Readings from Last 1 Encounters:  02/10/23 87

## 2023-06-17 NOTE — Telephone Encounter (Signed)
Medication Refill - Medication: amLODipine (NORVASC) 5 MG tablet   Has the patient contacted their pharmacy? Yes.    Preferred Pharmacy (with phone number or street name):  [id number] [place], [place] - [address] Phone: [phone]  Fax: [phone]     Has the patient been seen for an appointment in the last year OR does the patient have an upcoming appointment? Yes.    Agent: Please be advised that RX refills may take up to 3 business days. We ask that you follow-up with your pharmacy.

## 2023-07-16 NOTE — Patient Instructions (Signed)
Be Involved in Caring For Your Health:  Taking Medications When medications are taken as directed, they can greatly improve your health. But if they are not taken as prescribed, they may not work. In some cases, not taking them correctly can be harmful. To help ensure your treatment remains effective and safe, understand your medications and how to take them. Bring your medications to each visit for review by your provider.  Your lab results, notes, and after visit summary will be available on My Chart. We strongly encourage you to use this feature. If lab results are abnormal the clinic will contact you with the appropriate steps. If the clinic does not contact you assume the results are satisfactory. You can always view your results on My Chart. If you have questions regarding your health or results, please contact the clinic during office hours. You can also ask questions on My Chart.  We at [facility] are grateful that you chose us to provide your care. We strive to provide evidence-based and compassionate care and are always looking for feedback. If you get a survey from the clinic please complete this so we can hear your opinions.  DASH Eating Plan DASH stands for Dietary Approaches to Stop Hypertension. The DASH eating plan is a healthy eating plan that has been shown to: Lower high blood pressure (hypertension). Reduce your risk for type 2 diabetes, heart disease, and stroke. Help with weight loss. What are tips for following this plan? Reading food labels Check food labels for the amount of salt (sodium) per serving. Choose foods with less than 5 percent of the Daily Value (DV) of sodium. In general, foods with less than 300 milligrams (mg) of sodium per serving fit into this eating plan. To find whole grains, look for the word "whole" as the first word in the ingredient list. Shopping Buy products labeled as "low-sodium" or "no salt added." Buy fresh foods. Avoid canned  foods and pre-made or frozen meals. Cooking Try not to add salt when you cook. Use salt-free seasonings or herbs instead of table salt or sea salt. Check with your health care provider or pharmacist before using salt substitutes. Do not fry foods. Cook foods in healthy ways, such as baking, boiling, grilling, roasting, or broiling. Cook using oils that are good for your heart. These include olive, canola, avocado, soybean, and sunflower oil. Meal planning  Eat a balanced diet. This should include: 4 or more servings of fruits and 4 or more servings of vegetables each day. Try to fill half of your plate with fruits and vegetables. 6-8 servings of whole grains each day. 6 or less servings of lean meat, poultry, or fish each day. 1 oz is 1 serving. A 3 oz (85 g) serving of meat is about the same size as the palm of your hand. One egg is 1 oz (28 g). 2-3 servings of low-fat dairy each day. One serving is 1 cup (237 mL). 1 serving of nuts, seeds, or beans 5 times each week. 2-3 servings of heart-healthy fats. Healthy fats called omega-3 fatty acids are found in foods such as walnuts, flaxseeds, fortified milks, and eggs. These fats are also found in cold-water fish, such as sardines, salmon, and mackerel. Limit how much you eat of: Canned or prepackaged foods. Food that is high in trans fat, such as fried foods. Food that is high in saturated fat, such as fatty meat. Desserts and other sweets, sugary drinks, and other foods with added sugar. Full-fat   dairy products. Do not salt foods before eating. Do not eat more than 4 egg yolks a week. Try to eat at least 2 vegetarian meals a week. Eat more home-cooked food and less restaurant, buffet, and fast food. Lifestyle When eating at a restaurant, ask if your food can be made with less salt or no salt. If you drink alcohol: Limit how much you have to: 0-1 drink a day if you are female. 0-2 drinks a day if you are female. Know how much alcohol is in  your drink. In the [place] one drink is one 12 oz bottle of beer (355 mL), one 5 oz glass of wine (148 mL), or one 1 oz glass of hard liquor (44 mL). General information Avoid eating more than 2,300 mg of salt a day. If you have hypertension, you may need to reduce your sodium intake to 1,500 mg a day. Work with your provider to stay at a healthy body weight or lose weight. Ask what the best weight range is for you. On most days of the week, get at least 30 minutes of exercise that causes your heart to beat faster. This may include walking, swimming, or biking. Work with your provider or dietitian to adjust your eating plan to meet your specific calorie needs. What foods should I eat? Fruits All fresh, dried, or frozen fruit. Canned fruits that are in their natural juice and do not have sugar added to them. Vegetables Fresh or frozen vegetables that are raw, steamed, roasted, or grilled. Low-sodium or reduced-sodium tomato and vegetable juice. Low-sodium or reduced-sodium tomato sauce and tomato paste. Low-sodium or reduced-sodium canned vegetables. Grains Whole-grain or whole-wheat bread. Whole-grain or whole-wheat pasta. Brown rice. Oatmeal. Quinoa. Bulgur. Whole-grain and low-sodium cereals. Pita bread. Low-fat, low-sodium crackers. Whole-wheat flour tortillas. Meats and other proteins Skinless chicken or turkey. Ground chicken or turkey. Pork with fat trimmed off. Fish and seafood. Egg whites. Dried beans, peas, or lentils. Unsalted nuts, nut butters, and seeds. Unsalted canned beans. Lean cuts of beef with fat trimmed off. Low-sodium, lean precooked or cured meat, such as sausages or meat loaves. Dairy Low-fat (1%) or fat-free (skim) milk. Reduced-fat, low-fat, or fat-free cheeses. Nonfat, low-sodium ricotta or cottage cheese. Low-fat or nonfat yogurt. Low-fat, low-sodium cheese. Fats and oils Soft margarine without trans fats. Vegetable oil. Reduced-fat, low-fat, or light mayonnaise and salad  dressings (reduced-sodium). Canola, safflower, olive, avocado, soybean, and sunflower oils. Avocado. Seasonings and condiments Herbs. Spices. Seasoning mixes without salt. Other foods Unsalted popcorn and pretzels. Fat-free sweets. The items listed above may not be all the foods and drinks you can have. Talk to a dietitian to learn more. What foods should I avoid? Fruits Canned fruit in a light or heavy syrup. Fried fruit. Fruit in cream or butter sauce. Vegetables Creamed or fried vegetables. Vegetables in a cheese sauce. Regular canned vegetables that are not marked as low-sodium or reduced-sodium. Regular canned tomato sauce and paste that are not marked as low-sodium or reduced-sodium. Regular tomato and vegetable juices that are not marked as low-sodium or reduced-sodium. Pickles. Olives. Grains Baked goods made with fat, such as croissants, muffins, or some breads. Dry pasta or rice meal packs. Meats and other proteins Fatty cuts of meat. Ribs. Fried meat. Bacon. Bologna, salami, and other precooked or cured meats, such as sausages or meat loaves, that are not lean and low in sodium. Fat from the back of a pig (fatback). Bratwurst. Salted nuts and seeds. Canned beans with added salt. Canned   or smoked fish. Whole eggs or egg yolks. Chicken or turkey with skin. Dairy Whole or 2% milk, cream, and half-and-half. Whole or full-fat cream cheese. Whole-fat or sweetened yogurt. Full-fat cheese. Nondairy creamers. Whipped toppings. Processed cheese and cheese spreads. Fats and oils Butter. Stick margarine. Lard. Shortening. Ghee. Bacon fat. Tropical oils, such as coconut, palm kernel, or palm oil. Seasonings and condiments Onion salt, garlic salt, seasoned salt, table salt, and sea salt. Worcestershire sauce. Tartar sauce. Barbecue sauce. Teriyaki sauce. Soy sauce, including reduced-sodium soy sauce. Steak sauce. Canned and packaged gravies. Fish sauce. Oyster sauce. Cocktail sauce. Store-bought  horseradish. Ketchup. Mustard. Meat flavorings and tenderizers. Bouillon cubes. Hot sauces. Pre-made or packaged marinades. Pre-made or packaged taco seasonings. Relishes. Regular salad dressings. Other foods Salted popcorn and pretzels. The items listed above may not be all the foods and drinks you should avoid. Talk to a dietitian to learn more. Where to find more information National Heart, Lung, and Blood Institute (NHLBI): nhlbi.nih.gov American Heart Association (AHA): heart.org Academy of Nutrition and Dietetics: eatright.org National Kidney Foundation (NKF): kidney.org This information is not intended to replace advice given to you by your health care provider. Make sure you discuss any questions you have with your health care provider. Document Revised: 08/19/2022 Document Reviewed: 08/19/2022 Elsevier Patient Education  2024 Elsevier Inc.  

## 2023-07-17 ENCOUNTER — Other Ambulatory Visit: Payer: Self-pay | Admitting: Nurse Practitioner

## 2023-07-20 NOTE — Telephone Encounter (Signed)
Requested medication (s) are due for refill today: Yes  Requested medication (s) are on the active medication list: Yes  Last refill:  06/17/23  Future visit scheduled: Yes 07/22/23  Notes to clinic:  Unable to refill per protocol, courtesy refill already given, routing for provider approval.      Requested Prescriptions  Pending Prescriptions Disp Refills   amLODipine (NORVASC) 5 MG tablet [Pharmacy Med Name: AMLODIPINE BESYLATE 5 MG TAB] 30 tablet 0    Sig: Take 1 tablet (5 mg total) by mouth daily. Need office visit for further refills     Cardiovascular: Calcium Channel Blockers 2 Failed - 07/17/2023  9:36 AM      Failed - Valid encounter within last 6 months    Recent Outpatient Visits           1 year ago Benign hypertension   [place] [facility] [place], Corrie Dandy T, NP   1 year ago Benign hypertension   [place] [facility] [place], Corrie Dandy T, NP   1 year ago Seasonal allergic rhinitis, unspecified trigger   [place] [facility] Larae Grooms, NP   2 years ago Benign hypertension   [place] [facility] [place], Corrie Dandy T, NP   2 years ago Rectal bleed   [place] [facility] [place], Dorie Rank, NP       Future Appointments             In 2 days A[place] D[place] T, NP [place] [facility]            Passed - Last BP in normal range    BP Readings from Last 1 Encounters:  02/10/23 128/79         Passed - Last Heart Rate in normal range    Pulse Readings from Last 1 Encounters:  02/10/23 87

## 2023-07-22 ENCOUNTER — Ambulatory Visit (INDEPENDENT_AMBULATORY_CARE_PROVIDER_SITE_OTHER): Payer: BC Managed Care – PPO | Admitting: Nurse Practitioner

## 2023-07-22 ENCOUNTER — Encounter: Payer: Self-pay | Admitting: Nurse Practitioner

## 2023-07-22 VITALS — BP 118/74 | HR 101 | Temp 98.1°F | Ht 66.0 in | Wt 127.8 lb

## 2023-07-22 DIAGNOSIS — Z0000 Encounter for general adult medical examination without abnormal findings: Secondary | ICD-10-CM | POA: Diagnosis not present

## 2023-07-22 DIAGNOSIS — Z3042 Encounter for surveillance of injectable contraceptive: Secondary | ICD-10-CM | POA: Diagnosis not present

## 2023-07-22 DIAGNOSIS — E7800 Pure hypercholesterolemia, unspecified: Secondary | ICD-10-CM | POA: Diagnosis not present

## 2023-07-22 DIAGNOSIS — E876 Hypokalemia: Secondary | ICD-10-CM | POA: Diagnosis not present

## 2023-07-22 DIAGNOSIS — Z1231 Encounter for screening mammogram for malignant neoplasm of breast: Secondary | ICD-10-CM

## 2023-07-22 DIAGNOSIS — Z23 Encounter for immunization: Secondary | ICD-10-CM

## 2023-07-22 DIAGNOSIS — M8580 Other specified disorders of bone density and structure, unspecified site: Secondary | ICD-10-CM

## 2023-07-22 DIAGNOSIS — I10 Essential (primary) hypertension: Secondary | ICD-10-CM

## 2023-07-22 DIAGNOSIS — F17210 Nicotine dependence, cigarettes, uncomplicated: Secondary | ICD-10-CM

## 2023-07-22 LAB — PREGNANCY, URINE: Preg Test, Ur: NEGATIVE

## 2023-07-22 MED ORDER — AMLODIPINE BESYLATE 5 MG PO TABS: 5.0000 mg | ORAL_TABLET | Freq: Every day | ORAL | 4 refills | Status: DC

## 2023-07-22 NOTE — Assessment & Plan Note (Signed)
Ongoing with no recent fractures.  History of reported on chart, no DEXA noted.  Check Vit D level today.  Dexa due at 63.

## 2023-07-22 NOTE — Assessment & Plan Note (Signed)
History of on labs, recent was 3.5 on the lower end of normal.  Recheck today and focus on diet.

## 2023-07-22 NOTE — Assessment & Plan Note (Signed)
Chronic, stable.  BP at goal on recheck today and at work when checks.  Recommend she monitor BP at least a few mornings a week at home and document.  DASH diet at home.  Continue current medication regimen and adjust as needed.  Labs: CMP, TSH, CBC.  Return in 1 year for physical. Refills sent in.

## 2023-07-22 NOTE — Assessment & Plan Note (Signed)
I have recommended complete cessation of tobacco use. I have discussed various options available for assistance with tobacco cessation including over the counter methods (Nicotine gum, patch and lozenges). We also discussed prescription options (Chantix, Nicotine Inhaler / Nasal Spray). The patient is not interested in pursuing any prescription tobacco cessation options at this time.  Recommend annual lung CT -- she will think about this, but refuses today.

## 2023-07-22 NOTE — Assessment & Plan Note (Signed)
Obtains on schedule -- urine pregnancy testing annually.

## 2023-07-22 NOTE — Assessment & Plan Note (Signed)
Ongoing. Noted on labs, recheck levels today.  Start medication as needed.  ASCVD 7.9%, may benefit low dose Crestor in future which we discussed today.  Will recheck labs and she will think about this.

## 2023-07-22 NOTE — Progress Notes (Signed)
BP 118/74 (BP Location: Left Arm, Patient Position: Sitting, Cuff Size: Normal)   Pulse (!) 101   Temp 98.1 F (36.7 C) (Oral)   Ht 5\' 6"  (1.676 m)   Wt 127 lb 12.8 oz (58 kg)   SpO2 98%   BMI 20.63 kg/m    Subjective:    Patient ID: Wanda Hudson, female    DOB: 04/06/1971, 52 y.o.   MRN: [id number]  HPI: Wanda Hudson is a 52 y.o. female presenting on 07/22/2023 for comprehensive medical examination. Current medical complaints include:none  She currently lives with: self Menopausal Symptoms: no  HYPERTENSION Continues to take Amlodipine.  Smokes 1 PPD (increase a little) -- has smoked since age 60.  Does not want to attend lung cancer screening, have discussed at each visit.  Osteopenia noted on past imaging, taking supplement Vit D. Hypertension status: stable  Satisfied with current treatment? yes Duration of hypertension: chronic BP monitoring frequency:  once a week BP range: 120//60-70 range at work BP medication side effects:  no Medication compliance: good compliance Aspirin: no Recurrent headaches: no Visual changes: no Palpitations: no Dyspnea: no Chest pain: no Lower extremity edema: no Dizzy/lightheaded: no  The 10-year ASCVD risk score (Arnett DK, et al., 2019) is: 7.9%   Values used to calculate the score:     Age: 23 years     Sex: Female     Is Non-Hispanic African American: Yes     Diabetic: No     Tobacco smoker: Yes     Systolic Blood Pressure: 118 mmHg     Is BP treated: Yes     HDL Cholesterol: 46 mg/dL     Total Cholesterol: 193 mg/dL  Depression Screen done today and results listed below:     07/22/2023    8:11 AM 07/20/2022    8:25 AM 03/01/2022    1:30 PM 01/30/2021    3:02 PM 09/04/2020    8:02 AM  Depression screen PHQ 2/9  Decreased Interest 0 0 0 0 0  Down, Depressed, Hopeless 0 0 0 0 0  PHQ - 2 Score 0 0 0 0 0  Altered sleeping 0 0 0  0  Tired, decreased energy 0 0 0  0  Change in appetite 0 0 0  0  Feeling bad or  failure about yourself  0 0 0  0  Trouble concentrating 0 0 0  0  Moving slowly or fidgety/restless 0 0 0  0  Suicidal thoughts 0 0 0  0  PHQ-9 Score 0 0 0  0  Difficult doing work/chores Not difficult at all Not difficult at all Not difficult at all        07/22/2023    8:12 AM 07/20/2022    8:25 AM 03/01/2022    1:30 PM 07/02/2019    1:58 PM  GAD 7 : Generalized Anxiety Score  Nervous, Anxious, on Edge 0 0 0 0  Control/stop worrying 0 0 0 0  Worry too much - different things 0 0 0 0  Trouble relaxing 0 0 0 0  Restless 0 0 0 0  Easily annoyed or irritable 0 0 0 0  Afraid - awful might happen 0 0 0 0  Total GAD 7 Score 0 0 0 0  Anxiety Difficulty Not difficult at all Not difficult at all Not difficult at all Not difficult at all   Functional Status Survey: Is the patient deaf or have difficulty hearing?: No Does the  patient have difficulty seeing, even when wearing glasses/contacts?: No Does the patient have difficulty concentrating, remembering, or making decisions?: No Does the patient have difficulty walking or climbing stairs?: No Does the patient have difficulty dressing or bathing?: No Does the patient have difficulty doing errands alone such as visiting a doctor's office or shopping?: No      09/04/2020    8:02 AM 03/01/2022    1:30 PM 07/20/2022    8:25 AM 07/20/2022    8:46 AM 07/22/2023    8:11 AM  Fall Risk  Falls in the past year? 0 0 0 0 0  Was there an injury with Fall?  0 0 0 0  Fall Risk Category Calculator  0 0 0 0  Fall Risk Category (Retired)  Low Low Low   (RETIRED) Patient Fall Risk Level  Low fall risk  Low fall risk   Patient at Risk for Falls Due to  No Fall Risks No Fall Risks No Fall Risks No Fall Risks  Fall risk Follow up  Falls evaluation completed Falls evaluation completed Falls prevention discussed Falls evaluation completed     Past Medical History:  Past Medical History:  Diagnosis Date   Anxiety    Breast cyst, left 2017   Depression     Hypertension    Insomnia    Osteopenia    Peripheral neuropathy     Surgical History:  Past Surgical History:  Procedure Laterality Date   BREAST CYST ASPIRATION Left 2017   [facility]   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   Aspirin-Salicylamide-Caffeine 650-195-33.3 MG PACK Take by mouth as needed.   Cyanocobalamin (B-12) 1000 MCG CAPS B12   Evening Primrose Oil 500 MG CAPS Take 500 mg by mouth daily.   fluticasone (FLONASE) 50 MCG/ACT nasal spray Place 2 sprays into both nostrils daily.   Vitamin D, Cholecalciferol, 1000 UNITS TABS Take 1,000 Units by mouth daily.   Current Facility-Administered Medications on File Prior to Visit  Medication   medroxyPROGESTERone Acetate SUSY    Allergies:  Allergies  Allergen Reactions   Codeine Rash    Social History:  Social History   Socioeconomic History   Marital status: Single    Spouse name: Not on file   Number of children: Not on file   Years of education: Not on file   Highest education level: Not on file  Occupational History   Not on file  Tobacco Use   Smoking status: Some Days    Current packs/day: 0.50    Average packs/day: 0.5 packs/day for 21.0 years (10.5 ttl pk-yrs)    Types: Cigarettes   Smokeless tobacco: Never  Vaping Use   Vaping status: Never Used  Substance and Sexual Activity   Alcohol use: No    Comment: occasionally   Drug use: No   Sexual activity: Yes    Birth control/protection: Injection  Other Topics Concern   Not on file  Social History Narrative   Not on file   Social Determinants of Health    Financial  Resource Strain: Not on file  Food Insecurity: Not on file  Transportation Needs: Not on file  Physical Activity: Not on file  Stress: Not on file  Social Connections: Unknown (12/29/2021)   Received from [facility], [facility]   Social Network    Social Network: Not on file  Intimate Partner Violence: Unknown (11/19/2021)   Received from [employer], [facility]   HITS    Physically Hurt: Not on file    Insult or Talk Down To: Not on file    Threaten Physical Harm: Not on file    Scream or Curse: Not on file   Social History   Tobacco Use  Smoking Status Some Days   Current packs/day: 0.50   Average packs/day: 0.5 packs/day for 21.0 years (10.5 ttl pk-yrs)   Types: Cigarettes  Smokeless Tobacco Never   Social History   Substance and Sexual Activity  Alcohol Use No   Comment: occasionally    Family History:  Family History  Problem Relation Age of Onset   Diabetes Mother    Hypertension Mother    Stroke Mother    Diabetes Father    Hypertension Father    Lung cancer Father    Heart disease Maternal Grandfather        MI   Heart disease Paternal Grandfather        MI   Hypertension Brother    Aneurysm Maternal Grandmother    Breast cancer Neg Hx    Past medical history, surgical history, medications, allergies, family history and social history reviewed with patient today and changes made to appropriate areas of the chart.   ROS All other ROS negative except what is listed above and in the HPI.      Objective:    BP 118/74 (BP Location: Left Arm, Patient Position: Sitting, Cuff Size: Normal)   Pulse (!) 101   Temp 98.1 F (36.7 C) (Oral)   Ht 5\' 6"  (1.676 m)   Wt 127 lb 12.8 oz (58 kg)   SpO2 98%   BMI 20.63 kg/m   Wt Readings from Last 3 Encounters:  07/22/23 127 lb 12.8 oz (58 kg)  02/10/23 126 lb 9.6 oz (57.4 kg)  07/20/22 127 lb (57.6 kg)    Physical Exam Vitals and nursing note reviewed. Exam conducted with a chaperone  present.  Constitutional:      General: She is awake. She is not in acute distress.    Appearance: She is well-developed and well-groomed. She is not ill-appearing or toxic-appearing.  HENT:     Head: Normocephalic and atraumatic.     Right Ear: Hearing, tympanic membrane, ear canal and external ear normal. No drainage.     Left Ear: Hearing, tympanic membrane, ear canal and external ear normal. No drainage.     Nose: Nose normal.     Right Sinus: No maxillary sinus tenderness or frontal sinus tenderness.     Left Sinus: No maxillary sinus tenderness or frontal sinus tenderness.     Mouth/Throat:     Mouth: Mucous membranes are moist.     Pharynx: Oropharynx is clear. Uvula midline. No pharyngeal swelling, oropharyngeal exudate or posterior oropharyngeal erythema.  Eyes:     General: Lids are normal.        Right eye: No discharge.        Left eye: No discharge.     Extraocular Movements: Extraocular movements intact.     Conjunctiva/sclera: Conjunctivae normal.     Pupils:  Pupils are equal, round, and reactive to light.     Visual Fields: Right eye visual fields normal and left eye visual fields normal.  Neck:     Thyroid: No thyromegaly.     Vascular: No carotid bruit.     Trachea: Trachea normal.  Cardiovascular:     Rate and Rhythm: Normal rate and regular rhythm.     Heart sounds: Normal heart sounds. No murmur heard.    No gallop.  Pulmonary:     Effort: Pulmonary effort is normal. No accessory muscle usage or respiratory distress.     Breath sounds: Normal breath sounds.  Chest:  Breasts:    Right: Normal.     Left: Normal.  Abdominal:     General: Bowel sounds are normal.     Palpations: Abdomen is soft. There is no hepatomegaly or splenomegaly.     Tenderness: There is no abdominal tenderness.  Musculoskeletal:        General: Normal range of motion.     Cervical back: Normal range of motion and neck supple.     Right lower leg: No edema.     Left lower leg: No  edema.  Lymphadenopathy:     Head:     Right side of head: No submental, submandibular, tonsillar, preauricular or posterior auricular adenopathy.     Left side of head: No submental, submandibular, tonsillar, preauricular or posterior auricular adenopathy.     Cervical: No cervical adenopathy.     Upper Body:     Right upper body: No supraclavicular, axillary or pectoral adenopathy.     Left upper body: No supraclavicular, axillary or pectoral adenopathy.  Skin:    General: Skin is warm and dry.     Capillary Refill: Capillary refill takes less than 2 seconds.     Findings: No rash.  Neurological:     Mental Status: She is alert and oriented to person, place, and time.     Gait: Gait is intact.     Deep Tendon Reflexes: Reflexes are normal and symmetric.     Reflex Scores:      Brachioradialis reflexes are 2+ on the right side and 2+ on the left side.      Patellar reflexes are 2+ on the right side and 2+ on the left side. Psychiatric:        Attention and Perception: Attention normal.        Mood and Affect: Mood normal.        Speech: Speech normal.        Behavior: Behavior normal. Behavior is cooperative.        Thought Content: Thought content normal.        Judgment: Judgment normal.    Results for orders placed or performed in visit on 07/20/22  CBC with Differential/Platelet  Result Value Ref Range   WBC 8.0 3.4 - 10.8 x10E3/uL   RBC 4.58 3.77 - 5.28 x10E6/uL   Hemoglobin 13.2 11.1 - 15.9 g/dL   Hematocrit 40.9 81.1 - 46.6 %   MCV 89 79 - 97 fL   MCH 28.8 26.6 - 33.0 pg   MCHC 32.4 31.5 - 35.7 g/dL   RDW 91.4 78.2 - 95.6 %   Platelets 374 150 - 450 x10E3/uL   Neutrophils 57 Not Estab. %   Lymphs 33 Not Estab. %   Monocytes 8 Not Estab. %   Eos 1 Not Estab. %   Basos 1 Not Estab. %   Neutrophils Absolute  4.5 1.4 - 7.0 x10E3/uL   Lymphocytes Absolute 2.7 0.7 - 3.1 x10E3/uL   Monocytes Absolute 0.7 0.1 - 0.9 x10E3/uL   EOS (ABSOLUTE) 0.1 0.0 - 0.4 x10E3/uL    Basophils Absolute 0.1 0.0 - 0.2 x10E3/uL   Immature Granulocytes 0 Not Estab. %   Immature Grans (Abs) 0.0 0.0 - 0.1 x10E3/uL  Comprehensive metabolic panel  Result Value Ref Range   Glucose 90 70 - 99 mg/dL   BUN 11 6 - 24 mg/dL   Creatinine, Ser 1.61 (L) 0.57 - 1.00 mg/dL   eGFR 096 >04 VW/UJW/1.19   BUN/Creatinine Ratio 21 9 - 23   Sodium 139 134 - 144 mmol/L   Potassium 3.5 3.5 - 5.2 mmol/L   Chloride 102 96 - 106 mmol/L   CO2 20 20 - 29 mmol/L   Calcium 9.7 8.7 - 10.2 mg/dL   Total Protein 7.7 6.0 - 8.5 g/dL   Albumin 5.0 (H) 3.8 - 4.9 g/dL   Globulin, Total 2.7 1.5 - 4.5 g/dL   Albumin/Globulin Ratio 1.9 1.2 - 2.2   Bilirubin Total 0.3 0.0 - 1.2 mg/dL   Alkaline Phosphatase 114 44 - 121 IU/L   AST 14 0 - 40 IU/L   ALT 9 0 - 32 IU/L  Lipid Panel w/o Chol/HDL Ratio  Result Value Ref Range   Cholesterol, Total 193 100 - 199 mg/dL   Triglycerides 147 (H) 0 - 149 mg/dL   HDL 46 >82 mg/dL   VLDL Cholesterol Cal 27 5 - 40 mg/dL   LDL Chol Calc (NIH) 956 (H) 0 - 99 mg/dL  TSH  Result Value Ref Range   TSH 0.850 0.450 - 4.500 uIU/mL  VITAMIN D 25 Hydroxy (Vit-D Deficiency, Fractures)  Result Value Ref Range   Vit D, 25-Hydroxy 17.3 (L) 30.0 - 100.0 ng/mL  Vitamin B12  Result Value Ref Range   Vitamin B-12 476 232 - 1,245 pg/mL  Pregnancy, urine  Result Value Ref Range   Preg Test, Ur Negative Negative      Assessment & Plan:   Problem List Items Addressed This Visit       Cardiovascular and Mediastinum   Benign hypertension - Primary    Chronic, stable.  BP at goal on recheck today and at work when checks.  Recommend she monitor BP at least a few mornings a week at home and document.  DASH diet at home.  Continue current medication regimen and adjust as needed.  Labs: CMP, TSH, CBC.  Return in 1 year for physical. Refills sent in.       Relevant Medications   amLODipine (NORVASC) 5 MG tablet   Other Relevant Orders   CBC with Differential/Platelet    Comprehensive metabolic panel   TSH     Musculoskeletal and Integument   Osteopenia    Ongoing with no recent fractures.  History of reported on chart, no DEXA noted.  Check Vit D level today.  Dexa due at 63.      Relevant Orders   VITAMIN D 25 Hydroxy (Vit-D Deficiency, Fractures)     Other   Depo-Provera contraceptive status    Obtains on schedule -- urine pregnancy testing annually.      Relevant Orders   Pregnancy, urine   Elevated low density lipoprotein (LDL) cholesterol level    Ongoing. Noted on labs, recheck levels today.  Start medication as needed.  ASCVD 7.9%, may benefit low dose Crestor in future which we discussed today.  Will recheck  labs and she will think about this.      Relevant Orders   Comprehensive metabolic panel   Lipid Panel w/o Chol/HDL Ratio   Hypokalemia    History of on labs, recent was 3.5 on the lower end of normal.  Recheck today and focus on diet.      Relevant Orders   Comprehensive metabolic panel   Nicotine dependence, cigarettes, uncomplicated    I have recommended complete cessation of tobacco use. I have discussed various options available for assistance with tobacco cessation including over the counter methods (Nicotine gum, patch and lozenges). We also discussed prescription options (Chantix, Nicotine Inhaler / Nasal Spray). The patient is not interested in pursuing any prescription tobacco cessation options at this time.  Recommend annual lung CT -- she will think about this, but refuses today.       Other Visit Diagnoses     Encounter for screening mammogram for malignant neoplasm of breast       Mammogram ordered and instructed how to schedule.   Relevant Orders   MM 3D SCREENING MAMMOGRAM BILATERAL BREAST   Encounter for annual physical exam       Annual physical today with labs and health maintenance reviewed, discussed with patient.        Follow up plan: Return in about 1 year (around 07/21/2024) for Annual  Physical.   LABORATORY TESTING:  - Pap smear: up to date  IMMUNIZATIONS:   - Tdap: Tetanus vaccination status reviewed: last tetanus booster within 10 years. - Influenza: Up to date - Pneumovax: Not applicable - Prevnar: Not applicable - COVID: Up to date - HPV: Not applicable - Shingrix vaccine: Up To Date - Lung Cancer Screening: refuses, have discussed several times  SCREENING: -Mammogram: Not Up to date -- will schedule - Colonoscopy: Up to date  - Bone Density: Not applicable  -Hearing Test: Not applicable  -Spirometry: Not applicable   PATIENT COUNSELING:   Advised to take 1 mg of folate supplement per day if capable of pregnancy.   Sexuality: Discussed sexually transmitted diseases, partner selection, use of condoms, avoidance of unintended pregnancy  and contraceptive alternatives.   Advised to avoid cigarette smoking.  I discussed with the patient that most people either abstain from alcohol or drink within safe limits (<=14/week and <=4 drinks/occasion for males, <=7/weeks and <= 3 drinks/occasion for females) and that the risk for alcohol disorders and other health effects rises proportionally with the number of drinks per week and how often a drinker exceeds daily limits.  Discussed cessation/primary prevention of drug use and availability of treatment for abuse.   Diet: Encouraged to adjust caloric intake to maintain  or achieve ideal body weight, to reduce intake of dietary saturated fat and total fat, to limit sodium intake by avoiding high sodium foods and not adding table salt, and to maintain adequate dietary potassium and calcium preferably from fresh fruits, vegetables, and low-fat dairy products.    Stressed the importance of regular exercise  Injury prevention: Discussed safety belts, safety helmets, smoke detector, smoking near bedding or upholstery.   Dental health: Discussed importance of regular tooth brushing, flossing, and dental visits.    NEXT  PREVENTATIVE PHYSICAL DUE IN 1 YEAR. Return in about 1 year (around 07/21/2024) for Annual Physical.

## 2023-07-23 LAB — COMPREHENSIVE METABOLIC PANEL WITH GFR
ALT: 13 IU/L (ref 0–32)
AST: 21 IU/L (ref 0–40)
Albumin: 4.7 g/dL (ref 3.8–4.9)
Alkaline Phosphatase: 117 IU/L (ref 44–121)
BUN/Creatinine Ratio: 23 (ref 9–23)
BUN: 12 mg/dL (ref 6–24)
Bilirubin Total: 0.5 mg/dL (ref 0.0–1.2)
CO2: 20 mmol/L (ref 20–29)
Calcium: 9.7 mg/dL (ref 8.7–10.2)
Chloride: 103 mmol/L (ref 96–106)
Creatinine, Ser: 0.53 mg/dL — ABNORMAL LOW (ref 0.57–1.00)
Globulin, Total: 3 g/dL (ref 1.5–4.5)
Glucose: 83 mg/dL (ref 70–99)
Potassium: 3.7 mmol/L (ref 3.5–5.2)
Sodium: 141 mmol/L (ref 134–144)
Total Protein: 7.7 g/dL (ref 6.0–8.5)
eGFR: 110 mL/min/1.73

## 2023-07-23 LAB — CBC WITH DIFFERENTIAL/PLATELET
Basophils Absolute: 0.1 10*3/uL (ref 0.0–0.2)
Basos: 1 %
EOS (ABSOLUTE): 0.1 10*3/uL (ref 0.0–0.4)
Eos: 1 %
Hematocrit: 43 % (ref 34.0–46.6)
Hemoglobin: 13.7 g/dL (ref 11.1–15.9)
Immature Grans (Abs): 0 10*3/uL (ref 0.0–0.1)
Immature Granulocytes: 0 %
Lymphocytes Absolute: 2.5 10*3/uL (ref 0.7–3.1)
Lymphs: 24 %
MCH: 28.7 pg (ref 26.6–33.0)
MCHC: 31.9 g/dL (ref 31.5–35.7)
MCV: 90 fL (ref 79–97)
Monocytes Absolute: 0.9 10*3/uL (ref 0.1–0.9)
Monocytes: 8 %
Neutrophils Absolute: 7 10*3/uL (ref 1.4–7.0)
Neutrophils: 66 %
Platelets: 364 10*3/uL (ref 150–450)
RBC: 4.77 x10E6/uL (ref 3.77–5.28)
RDW: 12.7 % (ref 11.7–15.4)
WBC: 10.6 10*3/uL (ref 3.4–10.8)

## 2023-07-23 LAB — LIPID PANEL W/O CHOL/HDL RATIO
Cholesterol, Total: 202 mg/dL — ABNORMAL HIGH (ref 100–199)
HDL: 54 mg/dL (ref >39)
LDL Chol Calc (NIH): 125 mg/dL — ABNORMAL HIGH (ref 0–99)
Triglycerides: 131 mg/dL (ref 0–149)
VLDL Cholesterol Cal: 23 mg/dL (ref 5–40)

## 2023-07-23 LAB — TSH: TSH: 0.644 u[IU]/mL (ref 0.450–4.500)

## 2023-07-23 LAB — VITAMIN D 25 HYDROXY (VIT D DEFICIENCY, FRACTURES): Vit D, 25-Hydroxy: 7.5 ng/mL — ABNORMAL LOW (ref 30.0–100.0)

## 2023-07-23 NOTE — Progress Notes (Signed)
Contacted via MyChart The 10-year ASCVD risk score (Arnett DK, et al., 2019) is: 6.9%   Values used to calculate the score:     Age: 52 years     Sex: Female     Is Non-Hispanic African American: Yes     Diabetic: No     Tobacco smoker: Yes     Systolic Blood Pressure: 118 mmHg     Is BP treated: Yes     HDL Cholesterol: 54 mg/dL     Total Cholesterol: 202 mg/dL   Good evening Wanda Hudson, your labs have returned: - Kidney function, creatinine and eGFR, remains stable, as is liver function, AST and ALT.  - Lipid panel continues to show elevations, however for now we can continue diet focus but if continues trend up we may need to discuss medication in future. - Vitamin D is very low.  Are you taking Vitamin D3 2000 units daily? If not please do so and if you are taking it let me know as we may need to change dosing. - Remainder of labs are stable.  Any questions? Keep being stellar!!  Thank you for allowing me to participate in your care.  I appreciate you. Kindest regards, Marceil Welp

## 2023-08-19 ENCOUNTER — Ambulatory Visit
Admission: RE | Admit: 2023-08-19 | Discharge: 2023-08-19 | Disposition: A | Discharge status: Home / Self Care | Payer: BC Managed Care – PPO | Source: Ambulatory Visit | Attending: Nurse Practitioner | Admitting: Nurse Practitioner

## 2023-08-19 DIAGNOSIS — Z1231 Encounter for screening mammogram for malignant neoplasm of breast: Secondary | ICD-10-CM | POA: Insufficient documentation

## 2023-08-23 ENCOUNTER — Ambulatory Visit: Payer: BC Managed Care – PPO

## 2023-08-23 ENCOUNTER — Other Ambulatory Visit: Payer: Self-pay | Admitting: Nurse Practitioner

## 2023-08-23 DIAGNOSIS — R928 Other abnormal and inconclusive findings on diagnostic imaging of breast: Secondary | ICD-10-CM

## 2023-08-23 DIAGNOSIS — Z3042 Encounter for surveillance of injectable contraceptive: Secondary | ICD-10-CM | POA: Diagnosis not present

## 2023-08-23 NOTE — Progress Notes (Signed)
 Patient presented to clinic for Depo-Provera  injection. Last Depo administered on 05/31/2023 . Patient within  window. Administered 150 mg Depo-Provera  in the left ventrogluteal site per patient request. Patient tolerated well, no complications encountered. Patient given reminder calendar and asked to return to clinic between 11/08/2023 and 11/22/2023 . Patient verbalized understanding. No HCG needed if patient is on time for next injection.

## 2023-08-30 ENCOUNTER — Ambulatory Visit
Admission: RE | Admit: 2023-08-30 | Discharge: 2023-08-30 | Disposition: A | Discharge status: Home / Self Care | Payer: BC Managed Care – PPO | Source: Ambulatory Visit | Attending: Nurse Practitioner | Admitting: Nurse Practitioner

## 2023-08-30 DIAGNOSIS — R92333 Mammographic heterogeneous density, bilateral breasts: Secondary | ICD-10-CM | POA: Diagnosis not present

## 2023-08-30 DIAGNOSIS — R928 Other abnormal and inconclusive findings on diagnostic imaging of breast: Secondary | ICD-10-CM

## 2023-11-09 ENCOUNTER — Ambulatory Visit: Payer: BC Managed Care – PPO

## 2023-11-09 DIAGNOSIS — Z3042 Encounter for surveillance of injectable contraceptive: Secondary | ICD-10-CM

## 2023-11-09 NOTE — Progress Notes (Signed)
 Patient is in office today for a nurse visit for Birth Control Injection. Patient Injection was given in the  left dorsogluteal site as patient prefers. Patient tolerated injection well.

## 2024-02-01 ENCOUNTER — Ambulatory Visit

## 2024-02-06 ENCOUNTER — Ambulatory Visit (INDEPENDENT_AMBULATORY_CARE_PROVIDER_SITE_OTHER)

## 2024-02-06 DIAGNOSIS — Z3042 Encounter for surveillance of injectable contraceptive: Secondary | ICD-10-CM

## 2024-02-06 NOTE — Progress Notes (Signed)
 Patient is in office today for a nurse visit for Birth Control Injection. Patient Injection was given in the  Left deltoid. Patient tolerated injection well.

## 2024-03-23 ENCOUNTER — Ambulatory Visit

## 2024-04-23 ENCOUNTER — Ambulatory Visit

## 2024-04-23 DIAGNOSIS — Z3042 Encounter for surveillance of injectable contraceptive: Secondary | ICD-10-CM | POA: Diagnosis not present

## 2024-04-23 MED ORDER — MEDROXYPROGESTERONE ACETATE 150 MG/ML IM SUSP
150.0000 mg | INTRAMUSCULAR | Status: AC
  Administered 2024-04-23 – 2024-07-09 (×2): 150 mg via INTRAMUSCULAR

## 2024-07-09 ENCOUNTER — Ambulatory Visit (INDEPENDENT_AMBULATORY_CARE_PROVIDER_SITE_OTHER): Admitting: Nurse Practitioner

## 2024-07-09 DIAGNOSIS — Z3042 Encounter for surveillance of injectable contraceptive: Secondary | ICD-10-CM

## 2024-07-09 NOTE — Progress Notes (Signed)
 Patient is in office today for a nurse visit for Birth Control Injection. Patient Injection was given in the  Left upper quad. gluteus. Patient tolerated injection well. Next depo is due 09/24/24 - 10/08/24.

## 2024-07-14 NOTE — Patient Instructions (Incomplete)
 Be Involved in Caring For Your Health:  Taking Medications When medications are taken as directed, they can greatly improve your health. But if they are not taken as prescribed, they may not work. In some cases, not taking them correctly can be harmful. To help ensure your treatment remains effective and safe, understand your medications and how to take them. Bring your medications to each visit for review by your provider.  Your lab results, notes, and after visit summary will be available on My Chart. We strongly encourage you to use this feature. If lab results are abnormal the clinic will contact you with the appropriate steps. If the clinic does not contact you assume the results are satisfactory. You can always view your results on My Chart. If you have questions regarding your health or results, please contact the clinic during office hours. You can also ask questions on My Chart.  We at [facility] are grateful that you chose us  to provide your care. We strive to provide evidence-based and compassionate care and are always looking for feedback. If you get a survey from the clinic please complete this so we can hear your opinions.  Healthy Eating, Adult Healthy eating may help you get and keep a healthy body weight, reduce the risk of chronic disease, and live a long and productive life. It is important to follow a healthy eating pattern. Your nutritional and calorie needs should be met mainly by different nutrient-rich foods. What are tips for following this plan? Reading food labels Read labels and choose the following: Reduced or low sodium products. Juices with 100% fruit juice. Foods with low saturated fats (<3 g per serving) and high polyunsaturated and monounsaturated fats. Foods with whole grains, such as whole wheat, cracked wheat, brown rice, and wild rice. Whole grains that are fortified with folic acid. This is recommended for females who are pregnant or who want to  become pregnant. Read labels and do not eat or drink the following: Foods or drinks with added sugars. These include foods that contain brown sugar, corn sweetener, corn syrup, dextrose , fructose, glucose, high-fructose corn syrup, honey, invert sugar, lactose, malt syrup, maltose, molasses, raw sugar, sucrose, trehalose, or turbinado sugar. Limit your intake of added sugars to less than 10% of your total daily calories. Do not eat more than the following amounts of added sugar per day: 6 teaspoons (25 g) for females. 9 teaspoons (38 g) for males. Foods that contain processed or refined starches and grains. Refined grain products, such as white flour, degermed cornmeal, white bread, and white rice. Shopping Choose nutrient-rich snacks, such as vegetables, whole fruits, and nuts. Avoid high-calorie and high-sugar snacks, such as potato chips, fruit snacks, and candy. Use oil-based dressings and spreads on foods instead of solid fats such as butter, margarine, sour cream, or cream cheese. Limit pre-made sauces, mixes, and instant products such as flavored rice, instant noodles, and ready-made pasta. Try more plant-protein sources, such as tofu, tempeh, black beans, edamame, lentils, nuts, and seeds. Explore eating plans such as the Mediterranean diet or vegetarian diet. Try heart-healthy dips made with beans and healthy fats like hummus and guacamole. Vegetables go great with these. Cooking Use oil to saut or stir-fry foods instead of solid fats such as butter, margarine, or lard. Try baking, boiling, grilling, or broiling instead of frying. Remove the fatty part of meats before cooking. Steam vegetables in water  or broth. Meal planning  At meals, imagine dividing your plate into fourths: One-half of  your plate is fruits and vegetables. One-fourth of your plate is whole grains. One-fourth of your plate is protein, especially lean meats, poultry, eggs, tofu, beans, or nuts. Include low-fat  dairy as part of your daily diet. Lifestyle Choose healthy options in all settings, including home, work, school, restaurants, or stores. Prepare your food safely: Wash your hands after handling raw meats. Where you prepare food, keep surfaces clean by regularly washing with hot, soapy water . Keep raw meats separate from ready-to-eat foods, such as fruits and vegetables. Cook seafood, meat, poultry, and eggs to the recommended temperature. Get a food thermometer. Store foods at safe temperatures. In general: Keep cold foods at 84F (4.4C) or below. Keep hot foods at 184F (60C) or above. Keep your freezer at [facility] (-17.8C) or below. Foods are not safe to eat if they have been between the temperatures of 40-184F (4.4-60C) for more than 2 hours. What foods should I eat? Fruits Aim to eat 1-2 cups of fresh, canned (in natural juice), or frozen fruits each day. One cup of fruit equals 1 small apple, 1 large banana, 8 large strawberries, 1 cup (237 g) canned fruit,  cup (82 g) dried fruit, or 1 cup (240 mL) 100% juice. Vegetables Aim to eat 2-4 cups of fresh and frozen vegetables each day, including different varieties and colors. One cup of vegetables equals 1 cup (91 g) broccoli or cauliflower florets, 2 medium carrots, 2 cups (150 g) raw, leafy greens, 1 large tomato, 1 large bell pepper, 1 large sweet potato, or 1 medium white potato. Grains Aim to eat 5-10 ounce-equivalents of whole grains each day. Examples of 1 ounce-equivalent of grains include 1 slice of bread, 1 cup (40 g) ready-to-eat cereal, 3 cups (24 g) popcorn, or  cup (93 g) cooked rice. Meats and other proteins Try to eat 5-7 ounce-equivalents of protein each day. Examples of 1 ounce-equivalent of protein include 1 egg,  oz nuts (12 almonds, 24 pistachios, or 7 walnut halves), 1/4 cup (90 g) cooked beans, 6 tablespoons (90 g) hummus or 1 tablespoon (16 g) peanut butter. A cut of meat or fish that is the size of a deck of  cards is about 3-4 ounce-equivalents (85 g). Of the protein you eat each week, try to have at least 8 sounce (227 g) of seafood. This is about 2 servings per week. This includes salmon, trout, herring, sardines, and anchovies. Dairy Aim to eat 3 cup-equivalents of fat-free or low-fat dairy each day. Examples of 1 cup-equivalent of dairy include 1 cup (240 mL) milk, 8 ounces (250 g) yogurt, 1 ounces (44 g) natural cheese, or 1 cup (240 mL) fortified soy milk. Fats and oils Aim for about 5 teaspoons (21 g) of fats and oils per day. Choose monounsaturated fats, such as canola and olive oils, mayonnaise made with olive oil or avocado oil, avocados, peanut butter, and most nuts, or polyunsaturated fats, such as sunflower, corn, and soybean oils, walnuts, pine nuts, sesame seeds, sunflower seeds, and flaxseed. Beverages Aim for 6 eight-ounce glasses of water  per day. Limit coffee to 3-5 eight-ounce cups per day. Limit caffeinated beverages that have added calories, such as soda and energy drinks. If you drink alcohol: Limit how much you have to: 0-1 drink a day if you are female. 0-2 drinks a day if you are female. Know how much alcohol is in your drink. In the [place] one drink is one 12 oz bottle of beer (355 mL), one 5 oz glass of wine (  148 mL), or one 1 oz glass of hard liquor (44 mL). Seasoning and other foods Try not to add too much salt to your food. Try using herbs and spices instead of salt. Try not to add sugar to food. This information is based on [place] nutrition guidelines. To learn more, visit [employer]. Exact amounts may vary. You may need different amounts. This information is not intended to replace advice given to you by your health care provider. Make sure you discuss any questions you have with your health care provider. Document Revised: 05/03/2022 Document Reviewed: 05/03/2022 Elsevier Patient Education  2024 [employer].

## 2024-07-24 ENCOUNTER — Encounter: Payer: Self-pay | Admitting: Nurse Practitioner

## 2024-07-24 DIAGNOSIS — F17210 Nicotine dependence, cigarettes, uncomplicated: Secondary | ICD-10-CM

## 2024-07-24 DIAGNOSIS — Z124 Encounter for screening for malignant neoplasm of cervix: Secondary | ICD-10-CM

## 2024-07-24 DIAGNOSIS — Z23 Encounter for immunization: Secondary | ICD-10-CM

## 2024-07-24 DIAGNOSIS — E7800 Pure hypercholesterolemia, unspecified: Secondary | ICD-10-CM

## 2024-07-24 DIAGNOSIS — Z3042 Encounter for surveillance of injectable contraceptive: Secondary | ICD-10-CM

## 2024-07-24 DIAGNOSIS — Z1211 Encounter for screening for malignant neoplasm of colon: Secondary | ICD-10-CM

## 2024-07-24 DIAGNOSIS — Z1231 Encounter for screening mammogram for malignant neoplasm of breast: Secondary | ICD-10-CM

## 2024-07-24 DIAGNOSIS — M8588 Other specified disorders of bone density and structure, other site: Secondary | ICD-10-CM

## 2024-07-24 DIAGNOSIS — I10 Essential (primary) hypertension: Secondary | ICD-10-CM

## 2024-07-24 DIAGNOSIS — Z0000 Encounter for general adult medical examination without abnormal findings: Secondary | ICD-10-CM

## 2024-07-25 ENCOUNTER — Other Ambulatory Visit (HOSPITAL_COMMUNITY)
Admission: RE | Admit: 2024-07-25 | Discharge: 2024-07-25 | Disposition: A | Discharge status: Home / Self Care | Source: Ambulatory Visit | Attending: Nurse Practitioner | Admitting: Nurse Practitioner

## 2024-07-25 ENCOUNTER — Ambulatory Visit: Admitting: Nurse Practitioner

## 2024-07-25 ENCOUNTER — Encounter: Payer: Self-pay | Admitting: Nurse Practitioner

## 2024-07-25 VITALS — BP 136/78 | HR 84 | Temp 98.2°F | Resp 15 | Ht 65.98 in | Wt 126.0 lb

## 2024-07-25 DIAGNOSIS — Z1231 Encounter for screening mammogram for malignant neoplasm of breast: Secondary | ICD-10-CM | POA: Diagnosis not present

## 2024-07-25 DIAGNOSIS — E7800 Pure hypercholesterolemia, unspecified: Secondary | ICD-10-CM

## 2024-07-25 DIAGNOSIS — F17210 Nicotine dependence, cigarettes, uncomplicated: Secondary | ICD-10-CM

## 2024-07-25 DIAGNOSIS — Z0000 Encounter for general adult medical examination without abnormal findings: Secondary | ICD-10-CM | POA: Diagnosis not present

## 2024-07-25 DIAGNOSIS — J069 Acute upper respiratory infection, unspecified: Secondary | ICD-10-CM | POA: Diagnosis not present

## 2024-07-25 DIAGNOSIS — E876 Hypokalemia: Secondary | ICD-10-CM | POA: Diagnosis not present

## 2024-07-25 DIAGNOSIS — M8580 Other specified disorders of bone density and structure, unspecified site: Secondary | ICD-10-CM | POA: Diagnosis not present

## 2024-07-25 DIAGNOSIS — Z1211 Encounter for screening for malignant neoplasm of colon: Secondary | ICD-10-CM | POA: Diagnosis not present

## 2024-07-25 DIAGNOSIS — Z124 Encounter for screening for malignant neoplasm of cervix: Secondary | ICD-10-CM | POA: Diagnosis not present

## 2024-07-25 DIAGNOSIS — Z3042 Encounter for surveillance of injectable contraceptive: Secondary | ICD-10-CM

## 2024-07-25 DIAGNOSIS — I10 Essential (primary) hypertension: Secondary | ICD-10-CM

## 2024-07-25 MED ORDER — AMLODIPINE BESYLATE 5 MG PO TABS: 5.0000 mg | ORAL_TABLET | Freq: Every day | ORAL | 4 refills | Status: AC

## 2024-07-25 NOTE — Assessment & Plan Note (Signed)
 Ongoing. Noted on labs, recheck levels today.  Start medication as needed.  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  Will recheck labs and she will think about this.

## 2024-07-25 NOTE — Assessment & Plan Note (Signed)
 Acute with symptoms for 5 days. Recommend simple OTC treatment for now. Symptoms are more consistent with a viral upper respiratory infection. Discussed with her these usually run their course in 5-7 days. Unfortunately, antibiotics don't work against viruses and just increase risk of other issues such as diarrhea, yeast infections, and resistant infections. She is aware if symptoms continue next week with no improvement then to alert PCP and then would send in abx therapy. Recommend: - Increased rest - Increasing Fluids - Acetaminophen as needed for fever/pain.  - Salt water gargling, chloraseptic spray and throat lozenges - Mucinex.  - Saline sinus flushes or a neti pot.  - Humidifying the air.

## 2024-07-25 NOTE — Assessment & Plan Note (Signed)
 Chronic, stable. BP close to goal on recheck today, has been taking cold medication recently. At work BP has been at goal.  Recommend she monitor BP at least a few mornings a week at home and document.  DASH diet at home.  Continue current medication regimen and adjust as needed.  Labs: CMP, TSH, CBC.  Refills sent in.

## 2024-07-25 NOTE — Assessment & Plan Note (Signed)
 Obtains on schedule -- urine pregnancy testing annually.

## 2024-07-25 NOTE — Assessment & Plan Note (Signed)
 History of on labs, recent was stable.  Recheck today and focus on diet.

## 2024-07-25 NOTE — Progress Notes (Signed)
 BP 136/78 (BP Location: Left Arm, Patient Position: Sitting, Cuff Size: Normal)   Pulse 84   Temp 98.2 F (36.8 C) (Oral)   Resp 15   Ht 5' 5.98 (1.676 m)   Wt 126 lb (57.2 kg)   LMP  (LMP Unknown)   SpO2 96%   BMI 20.35 kg/m    Subjective:    Patient ID: Wanda Hudson, female    DOB: [birth year], 53 y.o.   MRN: [id number]  HPI: Wanda Hudson is a 53 y.o. female presenting on 07/25/2024 for comprehensive medical examination. Current medical complaints include:none  She currently lives with: self Menopausal Symptoms: no  No menstrual cycles since her son was [age]. Takes Depo for menopausal symptoms.  UPPER RESPIRATORY TRACT INFECTION Has been feeling bad since Saturday. Taking OTC cold medications. Fever: no Cough: yes Shortness of breath: no Wheezing: no Chest pain: no Chest tightness: no Chest congestion: no Nasal congestion: yes Runny nose: yes Post nasal drip: yes Sneezing: no Sore throat: a little bit at night Swollen glands: no Sinus pressure: no Headache: no Face pain: no Toothache: no Ear pain: none Ear pressure: none Eyes red/itching:a little bit to right eye Eye drainage/crusting: no  Vomiting: no Rash: no Fatigue: no Sick contacts: yes 1/2 the people at work Strep contacts: no  Context: stable Recurrent sinusitis: no Relief with OTC cold/cough medications: yes  Treatments attempted: cold/sinus and cough syrup    HYPERTENSION Taking Amlodipine  daily. Smokes 1 PPD. Has smoked since age 18.  Does not want to attend lung cancer screening, have discussed at each visit.   Hypertension status: stable  Satisfied with current treatment? yes Duration of hypertension: chronic BP monitoring frequency: every two weeks at work BP range: <130/80 on average BP medication side effects:  no Medication compliance: good compliance Aspirin: no Recurrent headaches: no Visual changes: no Palpitations: no Dyspnea: no Chest pain: no Lower extremity  edema: no Dizzy/lightheaded: no  The 10-year ASCVD risk score (Arnett DK, et al., 2019) is: 12%   Values used to calculate the score:     Age: 1 years     Clincally relevant sex: Female     Is Non-Hispanic African American: Yes     Diabetic: No     Tobacco smoker: Yes     Systolic Blood Pressure: 136 mmHg     Is BP treated: Yes     HDL Cholesterol: 54 mg/dL     Total Cholesterol: 202 mg/dL  OSTEOPENIA No recent falls or fractures. Continues Vitamin D  at home. Adequate calcium & vitamin D : no Weight bearing exercises: no   Depression Screen done today and results listed below:     07/25/2024    4:40 PM 07/22/2023    8:11 AM 07/20/2022    8:25 AM 03/01/2022    1:30 PM 01/30/2021    3:02 PM  Depression screen PHQ 2/9  Decreased Interest 0 0 0 0 0  Down, Depressed, Hopeless 0 0 0 0 0  PHQ - 2 Score 0 0 0 0 0  Altered sleeping 0 0 0 0   Tired, decreased energy 0 0 0 0   Change in appetite 0 0 0 0   Feeling bad or failure about yourself  0 0 0 0   Trouble concentrating 0 0 0 0   Moving slowly or fidgety/restless 0 0 0 0   Suicidal thoughts 0 0 0 0   PHQ-9 Score 0 0  0  0  Difficult doing work/chores  Not difficult at all Not difficult at all Not difficult at all      Data saved with a previous flowsheet row definition      07/25/2024    4:40 PM 07/22/2023    8:12 AM 07/20/2022    8:25 AM 03/01/2022    1:30 PM  GAD 7 : Generalized Anxiety Score  Nervous, Anxious, on Edge 0 0 0 0  Control/stop worrying 0 0 0 0  Worry too much - different things 0 0 0 0  Trouble relaxing 0 0 0 0  Restless 0 0 0 0  Easily annoyed or irritable 0 0 0 0  Afraid - awful might happen 0 0 0 0  Total GAD 7 Score 0 0 0 0  Anxiety Difficulty  Not difficult at all Not difficult at all Not difficult at all   Functional Status Survey: Is the patient deaf or have difficulty hearing?: No Does the patient have difficulty seeing, even when wearing glasses/contacts?: No Does the patient have  difficulty concentrating, remembering, or making decisions?: No Does the patient have difficulty walking or climbing stairs?: No Does the patient have difficulty dressing or bathing?: No Does the patient have difficulty doing errands alone such as visiting a doctor's office or shopping?: No      03/01/2022    1:30 PM 07/20/2022    8:25 AM 07/20/2022    8:46 AM 07/22/2023    8:11 AM 07/25/2024    4:36 PM  Fall Risk  Falls in the past year? 0 0 0 0 0  Was there an injury with Fall? 0  0  0  0  0  Fall Risk Category Calculator 0 0 0 0 0  Fall Risk Category (Retired) Low  Low  Low     (RETIRED) Patient Fall Risk Level Low fall risk   Low fall risk     Patient at Risk for Falls Due to No Fall Risks No Fall Risks No Fall Risks No Fall Risks No Fall Risks  Fall risk Follow up Falls evaluation completed  Falls evaluation completed  Falls prevention discussed  Falls evaluation completed Falls evaluation completed     Data saved with a previous flowsheet row definition    Past Medical History:  Past Medical History:  Diagnosis Date   Anxiety    Breast cyst, left 2017   Depression    Hypertension    Insomnia    Osteopenia    Peripheral neuropathy    Surgical History:  Past Surgical History:  Procedure Laterality Date   BREAST CYST ASPIRATION Left 2017   [facility]   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   Aspirin-Salicylamide-Caffeine 650-195-33.3 MG PACK Take by mouth as needed.   Cyanocobalamin  (B-12) 1000 MCG CAPS B12   Evening Primrose Oil 500 MG CAPS Take 500 mg by mouth daily.   fluticasone   (FLONASE ) 50 MCG/ACT nasal spray Place 2 sprays into both nostrils daily.   Vitamin D , Cholecalciferol, 1000 UNITS TABS Take 1,000 Units by mouth daily.  Current Facility-Administered Medications on File Prior to Visit  Medication   medroxyPROGESTERone  (DEPO-PROVERA ) injection 150 mg    Allergies:  Allergies  Allergen Reactions   Codeine Rash    Social History:  Social History   Socioeconomic History   Marital status: Single    Spouse name: Not on file   Number of children: Not on file   Years of education: Not on file   Highest education level: Not on file  Occupational History   Not on file  Tobacco Use   Smoking status: Some Days    Current packs/day: 0.50    Average packs/day: 0.5 packs/day for 21.0 years (10.5 ttl pk-yrs)    Types: Cigarettes   Smokeless tobacco: Never  Vaping Use   Vaping status: Never Used  Substance and Sexual Activity   Alcohol use: Yes    Alcohol/week: 1.0 standard drink of alcohol    Types: 1 Standard drinks or equivalent per week    Comment: occasionally   Drug use: Never   Sexual activity: Yes    Birth control/protection: Injection  Other Topics Concern   Not on file  Social History Narrative   Not on file   Social Drivers of Health   Financial Resource Strain: Low Risk  (07/25/2024)   Overall Financial Resource Strain (CARDIA)    Difficulty of Paying Living Expenses: Not hard at all  Food Insecurity: No Food Insecurity (07/25/2024)   Hunger Vital Sign    Worried About Running Out of Food in the Last Year: Never true    Ran Out of Food in the Last Year: Never true  Transportation Needs: No Transportation Needs (07/25/2024)   PRAPARE - Administrator, Civil Service (Medical): No    Lack of Transportation (Non-Medical): No  Physical Activity: Insufficiently Active (07/25/2024)   Exercise Vital Sign    Days of Exercise per Week: 3 days    Minutes of Exercise per Session: 20 min  Stress: No Stress Concern Present  (07/25/2024)   [employer] of Occupational Health - Occupational Stress Questionnaire    Feeling of Stress: Not at all  Social Connections: Socially Isolated (07/25/2024)   Social Connection and Isolation Panel    Frequency of Communication with Friends and Family: Twice a week    Frequency of Social Gatherings with Friends and Family: Once a week    Attends Religious Services: Never    Database Administrator or Organizations: No    Attends Banker Meetings: Never    Marital Status: Never married  Intimate Partner Violence: Not At Risk (07/25/2024)   Humiliation, Afraid, Rape, and Kick questionnaire    Fear of Current or Ex-Partner: No    Emotionally Abused: No    Physically Abused: No    Sexually Abused: No   Social History   Tobacco Use  Smoking Status Some Days   Current packs/day: 0.50   Average packs/day: 0.5 packs/day for 21.0 years (10.5 ttl pk-yrs)   Types: Cigarettes  Smokeless Tobacco Never   Social History   Substance and Sexual Activity  Alcohol Use Yes   Alcohol/week: 1.0 standard drink of alcohol   Types: 1 Standard drinks or equivalent per week   Comment: occasionally    Family History:  Family History  Problem Relation Age of Onset   Diabetes Mother    Hypertension Mother    Stroke Mother    Diabetes Father    Hypertension Father    Lung cancer Father  Heart disease Maternal Grandfather        MI   Heart disease Paternal Grandfather        MI   Hypertension Brother    Aneurysm Maternal Grandmother    Breast cancer Neg Hx    Past medical history, surgical history, medications, allergies, family history and social history reviewed with patient today and changes made to appropriate areas of the chart.   ROS All other ROS negative except what is listed above and in the HPI.      Objective:    BP 136/78 (BP Location: Left Arm, Patient Position: Sitting, Cuff Size: Normal)   Pulse 84   Temp 98.2 F (36.8 C) (Oral)   Resp  15   Ht 5' 5.98 (1.676 m)   Wt 126 lb (57.2 kg)   LMP  (LMP Unknown)   SpO2 96%   BMI 20.35 kg/m   Wt Readings from Last 3 Encounters:  07/25/24 126 lb (57.2 kg)  07/22/23 127 lb 12.8 oz (58 kg)  02/10/23 126 lb 9.6 oz (57.4 kg)    Physical Exam Vitals and nursing note reviewed. Exam conducted with a chaperone present.  Constitutional:      General: She is awake. She is not in acute distress.    Appearance: She is well-developed and well-groomed. She is not ill-appearing or toxic-appearing.  HENT:     Head: Normocephalic and atraumatic.     Right Ear: Hearing, ear canal and external ear normal. No drainage. A middle ear effusion is present. There is no impacted cerumen. Tympanic membrane is not injected.     Left Ear: Hearing, ear canal and external ear normal. No drainage. A middle ear effusion is present. There is no impacted cerumen. Tympanic membrane is not injected.     Nose: Rhinorrhea present. Rhinorrhea is clear.     Right Sinus: No maxillary sinus tenderness or frontal sinus tenderness.     Left Sinus: No maxillary sinus tenderness or frontal sinus tenderness.     Mouth/Throat:     Mouth: Mucous membranes are moist.     Pharynx: Oropharynx is clear. Uvula midline. Posterior oropharyngeal erythema (mild) present. No pharyngeal swelling or oropharyngeal exudate.  Eyes:     General: Lids are normal.        Right eye: No discharge.        Left eye: No discharge.     Extraocular Movements: Extraocular movements intact.     Conjunctiva/sclera: Conjunctivae normal.     Pupils: Pupils are equal, round, and reactive to light.     Visual Fields: Right eye visual fields normal and left eye visual fields normal.  Neck:     Thyroid : No thyromegaly.     Vascular: No carotid bruit.     Trachea: Trachea normal.  Cardiovascular:     Rate and Rhythm: Normal rate and regular rhythm.     Heart sounds: Normal heart sounds. No murmur heard.    No gallop.  Pulmonary:     Effort:  Pulmonary effort is normal. No accessory muscle usage or respiratory distress.     Breath sounds: Normal breath sounds. No decreased breath sounds, wheezing or rales.  Chest:  Breasts:    Right: Normal.     Left: Normal.  Abdominal:     General: Bowel sounds are normal.     Palpations: Abdomen is soft. There is no hepatomegaly or splenomegaly.     Tenderness: There is no abdominal tenderness.     Hernia:  There is no hernia in the left inguinal area or right inguinal area.  Genitourinary:    Exam position: Lithotomy position.     Labia:        Right: No rash.        Left: No rash.      Urethra: No prolapse.     Vagina: Normal.     Cervix: Normal.     Uterus: Normal.      Adnexa: Right adnexa normal and left adnexa normal.     Comments: Cervix anterior and viewed, pap obtained and sent to lab. Musculoskeletal:        General: Normal range of motion.     Cervical back: Normal range of motion and neck supple.     Right lower leg: No edema.     Left lower leg: No edema.  Lymphadenopathy:     Head:     Right side of head: No submental, submandibular, tonsillar, preauricular or posterior auricular adenopathy.     Left side of head: No submental, submandibular, tonsillar, preauricular or posterior auricular adenopathy.     Cervical: No cervical adenopathy.     Upper Body:     Right upper body: No supraclavicular, axillary or pectoral adenopathy.     Left upper body: No supraclavicular, axillary or pectoral adenopathy.  Skin:    General: Skin is warm and dry.     Capillary Refill: Capillary refill takes less than 2 seconds.     Findings: No rash.  Neurological:     Mental Status: She is alert and oriented to person, place, and time.     Gait: Gait is intact.     Deep Tendon Reflexes: Reflexes are normal and symmetric.     Reflex Scores:      Brachioradialis reflexes are 2+ on the right side and 2+ on the left side.      Patellar reflexes are 2+ on the right side and 2+ on the left  side. Psychiatric:        Attention and Perception: Attention normal.        Mood and Affect: Mood normal.        Speech: Speech normal.        Behavior: Behavior normal. Behavior is cooperative.        Thought Content: Thought content normal.        Judgment: Judgment normal.    Results for orders placed or performed in visit on 07/22/23  Pregnancy, urine   Collection Time: 07/22/23  8:27 AM  Result Value Ref Range   Preg Test, Ur Negative Negative  CBC with Differential/Platelet   Collection Time: 07/22/23  8:33 AM  Result Value Ref Range   WBC 10.6 3.4 - 10.8 x10E3/uL   RBC 4.77 3.77 - 5.28 x10E6/uL   Hemoglobin 13.7 11.1 - 15.9 g/dL   Hematocrit 56.9 65.9 - 46.6 %   MCV 90 79 - 97 fL   MCH 28.7 26.6 - 33.0 pg   MCHC 31.9 31.5 - 35.7 g/dL   RDW 87.2 88.2 - 84.5 %   Platelets 364 150 - 450 x10E3/uL   Neutrophils 66 Not Estab. %   Lymphs 24 Not Estab. %   Monocytes 8 Not Estab. %   Eos 1 Not Estab. %   Basos 1 Not Estab. %   Neutrophils Absolute 7.0 1.4 - 7.0 x10E3/uL   Lymphocytes Absolute 2.5 0.7 - 3.1 x10E3/uL   Monocytes Absolute 0.9 0.1 - 0.9 x10E3/uL   EOS (  ABSOLUTE) 0.1 0.0 - 0.4 x10E3/uL   Basophils Absolute 0.1 0.0 - 0.2 x10E3/uL   Immature Granulocytes 0 Not Estab. %   Immature Grans (Abs) 0.0 0.0 - 0.1 x10E3/uL  Comprehensive metabolic panel   Collection Time: 07/22/23  8:33 AM  Result Value Ref Range   Glucose 83 70 - 99 mg/dL   BUN 12 6 - 24 mg/dL   Creatinine, Ser 9.46 (L) 0.57 - 1.00 mg/dL   eGFR 889 >40 fO/fpw/8.26   BUN/Creatinine Ratio 23 9 - 23   Sodium 141 134 - 144 mmol/L   Potassium 3.7 3.5 - 5.2 mmol/L   Chloride 103 96 - 106 mmol/L   CO2 20 20 - 29 mmol/L   Calcium 9.7 8.7 - 10.2 mg/dL   Total Protein 7.7 6.0 - 8.5 g/dL   Albumin 4.7 3.8 - 4.9 g/dL   Globulin, Total 3.0 1.5 - 4.5 g/dL   Bilirubin Total 0.5 0.0 - 1.2 mg/dL   Alkaline Phosphatase 117 44 - 121 IU/L   AST 21 0 - 40 IU/L   ALT 13 0 - 32 IU/L  Lipid Panel w/o Chol/HDL  Ratio   Collection Time: 07/22/23  8:33 AM  Result Value Ref Range   Cholesterol, Total 202 (H) 100 - 199 mg/dL   Triglycerides 868 0 - 149 mg/dL   HDL 54 >60 mg/dL   VLDL Cholesterol Cal 23 5 - 40 mg/dL   LDL Chol Calc (NIH) 874 (H) 0 - 99 mg/dL  TSH   Collection Time: 07/22/23  8:33 AM  Result Value Ref Range   TSH 0.644 0.450 - 4.500 uIU/mL  VITAMIN D  25 Hydroxy (Vit-D Deficiency, Fractures)   Collection Time: 07/22/23  8:33 AM  Result Value Ref Range   Vit D, 25-Hydroxy 7.5 (L) 30.0 - 100.0 ng/mL      Assessment & Plan:   Problem List Items Addressed This Visit       Cardiovascular and Mediastinum   Benign hypertension - Primary   Chronic, stable. BP close to goal on recheck today, has been taking cold medication recently. At work BP has been at goal.  Recommend she monitor BP at least a few mornings a week at home and document.  DASH diet at home.  Continue current medication regimen and adjust as needed.  Labs: CMP, TSH, CBC.  Refills sent in.       Relevant Medications   amLODipine  (NORVASC ) 5 MG tablet   Other Relevant Orders   CBC with Differential/Platelet   Comprehensive metabolic panel with GFR   TSH     Respiratory   Upper respiratory tract infection   Acute with symptoms for 5 days. Recommend simple OTC treatment for now. Symptoms are more consistent with a viral upper respiratory infection. Discussed with her these usually run their course in 5-7 days. Unfortunately, antibiotics don't work against viruses and just increase risk of other issues such as diarrhea, yeast infections, and resistant infections. She is aware if symptoms continue next week with no improvement then to alert PCP and then would send in abx therapy. Recommend: - Increased rest - Increasing Fluids - Acetaminophen as needed for fever/pain.  - Salt water gargling, chloraseptic spray and throat lozenges - Mucinex.  - Saline sinus flushes or a neti pot.  - Humidifying the air.          Musculoskeletal and Integument   Osteopenia   Ongoing with no recent fractures.  History of reported on chart, no DEXA noted.  Check Vit D level today.  Dexa due at 63.      Relevant Orders   VITAMIN D  25 Hydroxy (Vit-D Deficiency, Fractures)     Other   Nicotine dependence, cigarettes, uncomplicated   I have recommended complete cessation of tobacco use. I have discussed various options available for assistance with tobacco cessation including over the counter methods (Nicotine gum, patch and lozenges). We also discussed prescription options (Chantix , Nicotine Inhaler / Nasal Spray). The patient is not interested in pursuing any prescription tobacco cessation options at this time.  Recommend annual lung CT -- she will think about this, but refuses today.       Hypokalemia   History of on labs, recent was stable.  Recheck today and focus on diet.      Relevant Orders   Comprehensive metabolic panel with GFR   Elevated low density lipoprotein (LDL) cholesterol level   Ongoing. Noted on labs, recheck levels today.  Start medication as needed.  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  Will recheck labs and she will think about this.      Relevant Orders   Comprehensive metabolic panel with GFR   Lipid Panel w/o Chol/HDL Ratio   Depo-Provera  contraceptive status   Obtains on schedule -- urine pregnancy testing annually.      Relevant Orders   Pregnancy, urine   Other Visit Diagnoses       Cervical cancer screening       Pap performed and sent to lab.   Relevant Orders   Cytology - PAP     Colon cancer screening       GI referral placed.   Relevant Orders   Ambulatory referral to Gastroenterology     Encounter for screening mammogram for malignant neoplasm of breast       Mammogram ordered and aware how to schedule.   Relevant Orders   MM 3D SCREENING MAMMOGRAM BILATERAL BREAST     Encounter for annual physical exam       Annual  physical today, educated patient.       Follow up plan: Return in about 6 months (around 01/23/2025) for HTN/HLD.   LABORATORY TESTING:  - Pap smear: Performed today  IMMUNIZATIONS:   - Tdap: Tetanus vaccination status reviewed: last tetanus booster within 10 years. - Influenza: Up to date obtained at work - Pneumovax: Not applicable - Prevnar: Will obtain PCV20 when feeling better - COVID: Up to date x 3 - HPV: Not applicable - Shingrix  vaccine: Up To Date - Lung Cancer Screening: refuses, have discussed several times  SCREENING: -Mammogram: Up To Date and will schedule for January - Colonoscopy: Referral to GI placed - Bone Density: Not applicable  -Hearing Test: Not applicable  -Spirometry: Not applicable   PATIENT COUNSELING:   Advised to take 1 mg of folate supplement per day if capable of pregnancy.   Sexuality: Discussed sexually transmitted diseases, partner selection, use of condoms, avoidance of unintended pregnancy  and contraceptive alternatives.   Advised to avoid cigarette smoking.  I discussed with the patient that most people either abstain from alcohol or drink within safe limits (<=14/week and <=4 drinks/occasion for males, <=7/weeks and <= 3 drinks/occasion for females) and that the risk for alcohol disorders and other health effects rises proportionally with the number of drinks per week and how often a drinker exceeds daily limits.  Discussed cessation/primary prevention of drug use and availability of treatment for abuse.  Diet: Encouraged to adjust caloric intake to maintain  or achieve ideal body weight, to reduce intake of dietary saturated fat and total fat, to limit sodium intake by avoiding high sodium foods and not adding table salt, and to maintain adequate dietary potassium and calcium preferably from fresh fruits, vegetables, and low-fat dairy products.    Stressed the importance of regular exercise  Injury prevention: Discussed safety belts,  safety helmets, smoke detector, smoking near bedding or upholstery.   Dental health: Discussed importance of regular tooth brushing, flossing, and dental visits.   NEXT PREVENTATIVE PHYSICAL DUE IN 1 YEAR. Return in about 6 months (around 01/23/2025) for HTN/HLD.

## 2024-07-25 NOTE — Patient Instructions (Signed)
 Be Involved in Caring For Your Health:  Taking Medications When medications are taken as directed, they can greatly improve your health. But if they are not taken as prescribed, they may not work. In some cases, not taking them correctly can be harmful. To help ensure your treatment remains effective and safe, understand your medications and how to take them. Bring your medications to each visit for review by your provider.  Your lab results, notes, and after visit summary will be available on My Chart. We strongly encourage you to use this feature. If lab results are abnormal the clinic will contact you with the appropriate steps. If the clinic does not contact you assume the results are satisfactory. You can always view your results on My Chart. If you have questions regarding your health or results, please contact the clinic during office hours. You can also ask questions on My Chart.  We at [facility] are grateful that you chose us  to provide your care. We strive to provide evidence-based and compassionate care and are always looking for feedback. If you get a survey from the clinic please complete this so we can hear your opinions.  Healthy Eating, Adult Healthy eating may help you get and keep a healthy body weight, reduce the risk of chronic disease, and live a long and productive life. It is important to follow a healthy eating pattern. Your nutritional and calorie needs should be met mainly by different nutrient-rich foods. What are tips for following this plan? Reading food labels Read labels and choose the following: Reduced or low sodium products. Juices with 100% fruit juice. Foods with low saturated fats (<3 g per serving) and high polyunsaturated and monounsaturated fats. Foods with whole grains, such as whole wheat, cracked wheat, brown rice, and wild rice. Whole grains that are fortified with folic acid. This is recommended for females who are pregnant or who want to  become pregnant. Read labels and do not eat or drink the following: Foods or drinks with added sugars. These include foods that contain brown sugar, corn sweetener, corn syrup, dextrose , fructose, glucose, high-fructose corn syrup, honey, invert sugar, lactose, malt syrup, maltose, molasses, raw sugar, sucrose, trehalose, or turbinado sugar. Limit your intake of added sugars to less than 10% of your total daily calories. Do not eat more than the following amounts of added sugar per day: 6 teaspoons (25 g) for females. 9 teaspoons (38 g) for males. Foods that contain processed or refined starches and grains. Refined grain products, such as white flour, degermed cornmeal, white bread, and white rice. Shopping Choose nutrient-rich snacks, such as vegetables, whole fruits, and nuts. Avoid high-calorie and high-sugar snacks, such as potato chips, fruit snacks, and candy. Use oil-based dressings and spreads on foods instead of solid fats such as butter, margarine, sour cream, or cream cheese. Limit pre-made sauces, mixes, and instant products such as flavored rice, instant noodles, and ready-made pasta. Try more plant-protein sources, such as tofu, tempeh, black beans, edamame, lentils, nuts, and seeds. Explore eating plans such as the Mediterranean diet or vegetarian diet. Try heart-healthy dips made with beans and healthy fats like hummus and guacamole. Vegetables go great with these. Cooking Use oil to saut or stir-fry foods instead of solid fats such as butter, margarine, or lard. Try baking, boiling, grilling, or broiling instead of frying. Remove the fatty part of meats before cooking. Steam vegetables in water  or broth. Meal planning  At meals, imagine dividing your plate into fourths: One-half of  your plate is fruits and vegetables. One-fourth of your plate is whole grains. One-fourth of your plate is protein, especially lean meats, poultry, eggs, tofu, beans, or nuts. Include low-fat  dairy as part of your daily diet. Lifestyle Choose healthy options in all settings, including home, work, school, restaurants, or stores. Prepare your food safely: Wash your hands after handling raw meats. Where you prepare food, keep surfaces clean by regularly washing with hot, soapy water . Keep raw meats separate from ready-to-eat foods, such as fruits and vegetables. Cook seafood, meat, poultry, and eggs to the recommended temperature. Get a food thermometer. Store foods at safe temperatures. In general: Keep cold foods at 84F (4.4C) or below. Keep hot foods at 184F (60C) or above. Keep your freezer at [facility] (-17.8C) or below. Foods are not safe to eat if they have been between the temperatures of 40-184F (4.4-60C) for more than 2 hours. What foods should I eat? Fruits Aim to eat 1-2 cups of fresh, canned (in natural juice), or frozen fruits each day. One cup of fruit equals 1 small apple, 1 large banana, 8 large strawberries, 1 cup (237 g) canned fruit,  cup (82 g) dried fruit, or 1 cup (240 mL) 100% juice. Vegetables Aim to eat 2-4 cups of fresh and frozen vegetables each day, including different varieties and colors. One cup of vegetables equals 1 cup (91 g) broccoli or cauliflower florets, 2 medium carrots, 2 cups (150 g) raw, leafy greens, 1 large tomato, 1 large bell pepper, 1 large sweet potato, or 1 medium white potato. Grains Aim to eat 5-10 ounce-equivalents of whole grains each day. Examples of 1 ounce-equivalent of grains include 1 slice of bread, 1 cup (40 g) ready-to-eat cereal, 3 cups (24 g) popcorn, or  cup (93 g) cooked rice. Meats and other proteins Try to eat 5-7 ounce-equivalents of protein each day. Examples of 1 ounce-equivalent of protein include 1 egg,  oz nuts (12 almonds, 24 pistachios, or 7 walnut halves), 1/4 cup (90 g) cooked beans, 6 tablespoons (90 g) hummus or 1 tablespoon (16 g) peanut butter. A cut of meat or fish that is the size of a deck of  cards is about 3-4 ounce-equivalents (85 g). Of the protein you eat each week, try to have at least 8 sounce (227 g) of seafood. This is about 2 servings per week. This includes salmon, trout, herring, sardines, and anchovies. Dairy Aim to eat 3 cup-equivalents of fat-free or low-fat dairy each day. Examples of 1 cup-equivalent of dairy include 1 cup (240 mL) milk, 8 ounces (250 g) yogurt, 1 ounces (44 g) natural cheese, or 1 cup (240 mL) fortified soy milk. Fats and oils Aim for about 5 teaspoons (21 g) of fats and oils per day. Choose monounsaturated fats, such as canola and olive oils, mayonnaise made with olive oil or avocado oil, avocados, peanut butter, and most nuts, or polyunsaturated fats, such as sunflower, corn, and soybean oils, walnuts, pine nuts, sesame seeds, sunflower seeds, and flaxseed. Beverages Aim for 6 eight-ounce glasses of water  per day. Limit coffee to 3-5 eight-ounce cups per day. Limit caffeinated beverages that have added calories, such as soda and energy drinks. If you drink alcohol: Limit how much you have to: 0-1 drink a day if you are female. 0-2 drinks a day if you are female. Know how much alcohol is in your drink. In the [place] one drink is one 12 oz bottle of beer (355 mL), one 5 oz glass of wine (  148 mL), or one 1 oz glass of hard liquor (44 mL). Seasoning and other foods Try not to add too much salt to your food. Try using herbs and spices instead of salt. Try not to add sugar to food. This information is based on [place] nutrition guidelines. To learn more, visit [employer]. Exact amounts may vary. You may need different amounts. This information is not intended to replace advice given to you by your health care provider. Make sure you discuss any questions you have with your health care provider. Document Revised: 05/03/2022 Document Reviewed: 05/03/2022 Elsevier Patient Education  2024 [employer].

## 2024-07-25 NOTE — Assessment & Plan Note (Signed)
 I have recommended complete cessation of tobacco use. I have discussed various options available for assistance with tobacco cessation including over the counter methods (Nicotine gum, patch and lozenges). We also discussed prescription options (Chantix, Nicotine Inhaler / Nasal Spray). The patient is not interested in pursuing any prescription tobacco cessation options at this time.  Recommend annual lung CT -- she will think about this, but refuses today.

## 2024-07-25 NOTE — Assessment & Plan Note (Signed)
 Ongoing with no recent fractures.  History of reported on chart, no DEXA noted.  Check Vit D level today.  Dexa due at 63.

## 2024-07-26 ENCOUNTER — Ambulatory Visit: Payer: Self-pay | Admitting: Nurse Practitioner

## 2024-07-26 LAB — CBC WITH DIFFERENTIAL/PLATELET
Basophils Absolute: 0.1 x10E3/uL (ref 0.0–0.2)
Basos: 0 %
EOS (ABSOLUTE): 0.1 x10E3/uL (ref 0.0–0.4)
Eos: 1 %
Hematocrit: 36.7 % (ref 34.0–46.6)
Hemoglobin: 12.2 g/dL (ref 11.1–15.9)
Immature Grans (Abs): 0 x10E3/uL (ref 0.0–0.1)
Immature Granulocytes: 0 %
Lymphocytes Absolute: 2.2 x10E3/uL (ref 0.7–3.1)
Lymphs: 19 %
MCH: 29.3 pg (ref 26.6–33.0)
MCHC: 33.2 g/dL (ref 31.5–35.7)
MCV: 88 fL (ref 79–97)
Monocytes Absolute: 1.1 x10E3/uL — ABNORMAL HIGH (ref 0.1–0.9)
Monocytes: 9 %
Neutrophils Absolute: 8.2 x10E3/uL — ABNORMAL HIGH (ref 1.4–7.0)
Neutrophils: 71 %
Platelets: 339 x10E3/uL (ref 150–450)
RBC: 4.17 x10E6/uL (ref 3.77–5.28)
RDW: 12.6 % (ref 11.7–15.4)
WBC: 11.7 x10E3/uL — ABNORMAL HIGH (ref 3.4–10.8)

## 2024-07-26 LAB — COMPREHENSIVE METABOLIC PANEL WITH GFR
ALT: 10 IU/L (ref 0–32)
AST: 15 IU/L (ref 0–40)
Albumin: 4.2 g/dL (ref 3.8–4.9)
Alkaline Phosphatase: 119 IU/L (ref 49–135)
BUN/Creatinine Ratio: 37 — ABNORMAL HIGH (ref 9–23)
BUN: 15 mg/dL (ref 6–24)
Bilirubin Total: 0.3 mg/dL (ref 0.0–1.2)
CO2: 19 mmol/L — ABNORMAL LOW (ref 20–29)
Calcium: 9.3 mg/dL (ref 8.7–10.2)
Chloride: 102 mmol/L (ref 96–106)
Creatinine, Ser: 0.41 mg/dL — ABNORMAL LOW (ref 0.57–1.00)
Globulin, Total: 3.1 g/dL (ref 1.5–4.5)
Glucose: 97 mg/dL (ref 70–99)
Potassium: 3.6 mmol/L (ref 3.5–5.2)
Sodium: 137 mmol/L (ref 134–144)
Total Protein: 7.3 g/dL (ref 6.0–8.5)
eGFR: 116 mL/min/1.73 (ref >59)

## 2024-07-26 LAB — LIPID PANEL W/O CHOL/HDL RATIO
Cholesterol, Total: 178 mg/dL (ref 100–199)
HDL: 41 mg/dL (ref >39)
LDL Chol Calc (NIH): 114 mg/dL — ABNORMAL HIGH (ref 0–99)
Triglycerides: 127 mg/dL (ref 0–149)
VLDL Cholesterol Cal: 23 mg/dL (ref 5–40)

## 2024-07-26 LAB — PREGNANCY, URINE: Preg Test, Ur: NEGATIVE

## 2024-07-26 LAB — VITAMIN D 25 HYDROXY (VIT D DEFICIENCY, FRACTURES): Vit D, 25-Hydroxy: 48.1 ng/mL (ref 30.0–100.0)

## 2024-07-26 LAB — TSH: TSH: 0.842 u[IU]/mL (ref 0.450–4.500)

## 2024-07-26 NOTE — Progress Notes (Signed)
 Contacted via MyChart The 10-year ASCVD risk score (Arnett DK, et al., 2019) is: 13.9%   Values used to calculate the score:     Age: 53 years     Clinically relevant sex: Female     Is Non-Hispanic African American: Yes     Diabetic: No     Tobacco smoker: Yes     Systolic Blood Pressure: 136 mmHg     Is BP treated: Yes     HDL Cholesterol: 41 mg/dL     Total Cholesterol: 178 mg/dL  Good evening Wanda Hudson, your labs have returned: - CBC is showing mild elevation in white blood cell count and neutrophils, I suspect this is related to your recent illness and we will recheck next visit. As discussed ensure plenty of rest and fluid intake. - Kidney function, creatinine and eGFR, remains stable, as is liver function, AST and ALT.  - Thyroid  and Vitamin D  are normal. - Your LDL is above normal.  The LDL is the bad cholesterol.  Over time and in combination with inflammation and other factors, this contributes to plaque which in turn may lead to stroke and/or heart attack down the road.  Sometimes high LDL is primarily genetic, and people might be eating all the right foods but still have high numbers.  Other times, there is room for improvement in one's diet and eating healthier can bring this number down and potentially reduce one's risk of heart attack and/or stroke. To reduce your LDL, Remember - more fruits and vegetables, more fish, and limit red meat and dairy products.  More soy, nuts, beans, barley, lentils, oats and plant sterol ester enriched margarine instead of butter.  I also encourage eliminating sugar and processed food.  Remember, shop on the outside of the grocery store and visit your [employer]. Anyone at or over 7.5% on ASCVD risk score, I offer a statin. Your score is 13.9% on calculation.   The task force found that in adults at increased risk but with no history of CVD events, statin therapy significantly reduced the risk of clinical outcomes such as stroke or heart attack by  22% to 33%, while also significantly reducing the risks of all-cause mortality and composite cardiovascular outcomes. Statins both lower cholesterol, which is helpful in reducing the risk of stroke and heart attack. Even outside of lowering cholesterol, statins seem to protect heart health. Other important parts of heart health are healthy diet and regular exercise. Would you like to trial a low dose of Rosuvastatin? Let me know. Any questions? Keep being amazing!!  Thank you for allowing me to participate in your care.  I appreciate you. Kindest regards, Wanda Hudson

## 2024-07-27 LAB — CYTOLOGY - PAP
Comment: NEGATIVE
Diagnosis: NEGATIVE
High risk HPV: NEGATIVE

## 2024-07-27 NOTE — Progress Notes (Signed)
 Contacted via MyChart  Good afternoon Wanda Hudson, your pap returned and is normal - negative cytology and HPV testing. Great news!! Repeat in 5 years.

## 2024-07-30 ENCOUNTER — Telehealth: Payer: Self-pay

## 2024-07-30 ENCOUNTER — Other Ambulatory Visit: Payer: Self-pay

## 2024-07-30 NOTE — Telephone Encounter (Signed)
 Colonoscopy not due until 2030.     [facility] [address] [place], [place]  [address] Phone:  [phone]   Fax:  [phone]   07/03/2019 MRN: [id number]     QUENNA DOEPKE [address] [place] [address]   Dear Ms. Strohmeier,   I am writing to inform you that the polyps removed from your colon were NOT pre-cancerous.    I recommend that you have a repeat colonoscopy exam in 10 years for routine colorectal screening.    Should you develop new or worsening symptoms of abdominal pain, bowel habit changes, or bleeding form the rectum or bowels, please schedule an evaluation either with me or your primary care physician.    Please call us  if you have persistent problems or have questions about your condition that have not been fully answered at this time.   Sincerely,        Rogelia Copping, MD

## 2024-07-30 NOTE — Progress Notes (Unsigned)
 Colonoscopy not due until 2030.     [facility] [address] [place], [place]  [address] Phone:  [phone]   Fax:  [phone]   07/03/2019 MRN: [id number]     Wanda Hudson [address] [place] [address]   Dear Ms. Rawling,   I am writing to inform you that the polyps removed from your colon were NOT pre-cancerous.    I recommend that you have a repeat colonoscopy exam in 10 years for routine colorectal screening.    Should you develop new or worsening symptoms of abdominal pain, bowel habit changes, or bleeding form the rectum or bowels, please schedule an evaluation either with me or your primary care physician.    Please call us  if you have persistent problems or have questions about your condition that have not been fully answered at this time.   Sincerely,        Rogelia Copping, MD          Thanks,  Rosaline, CMA

## 2024-07-31 ENCOUNTER — Encounter: Payer: Self-pay | Admitting: Nurse Practitioner

## 2024-07-31 MED ORDER — AZITHROMYCIN 250 MG PO TABS: ORAL_TABLET | ORAL | 0 refills | Status: AC

## 2024-09-24 ENCOUNTER — Ambulatory Visit

## 2025-01-23 ENCOUNTER — Ambulatory Visit: Admitting: Nurse Practitioner
